# Patient Record
Sex: Male | Born: 1990
Health system: Southern US, Community
[De-identification: ages and names within clinical notes are randomized; demographics above are authoritative.]

## PROBLEM LIST (undated history)

## (undated) DIAGNOSIS — Z87898 Personal history of other specified conditions: Secondary | ICD-10-CM

## (undated) DIAGNOSIS — R011 Cardiac murmur, unspecified: Secondary | ICD-10-CM

## (undated) DIAGNOSIS — I4891 Unspecified atrial fibrillation: Secondary | ICD-10-CM

## (undated) DIAGNOSIS — F419 Anxiety disorder, unspecified: Secondary | ICD-10-CM

## (undated) HISTORY — PX: TONSILLECTOMY: SUR1361

## (undated) HISTORY — PX: ADENOIDECTOMY: SUR15

## (undated) HISTORY — DX: Personal history of other specified conditions: Z87.898

## (undated) HISTORY — DX: Cardiac murmur, unspecified: R01.1

## (undated) HISTORY — DX: Unspecified atrial fibrillation: I48.91

---
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
## 2014-01-23 ENCOUNTER — Encounter (HOSPITAL_BASED_OUTPATIENT_CLINIC_OR_DEPARTMENT_OTHER): Payer: Self-pay | Admitting: Emergency Medicine

## 2014-01-23 ENCOUNTER — Emergency Department (HOSPITAL_BASED_OUTPATIENT_CLINIC_OR_DEPARTMENT_OTHER): Payer: BC Managed Care – PPO

## 2014-01-23 ENCOUNTER — Emergency Department (HOSPITAL_BASED_OUTPATIENT_CLINIC_OR_DEPARTMENT_OTHER)
Admission: EM | Admit: 2014-01-23 | Discharge: 2014-01-23 | Disposition: A | Discharge status: Home / Self Care | Payer: BC Managed Care – PPO | Attending: Emergency Medicine | Admitting: Emergency Medicine

## 2014-01-23 DIAGNOSIS — S6000XA Contusion of unspecified finger without damage to nail, initial encounter: Secondary | ICD-10-CM | POA: Insufficient documentation

## 2014-01-23 DIAGNOSIS — Y9389 Activity, other specified: Secondary | ICD-10-CM | POA: Insufficient documentation

## 2014-01-23 DIAGNOSIS — S61209A Unspecified open wound of unspecified finger without damage to nail, initial encounter: Secondary | ICD-10-CM | POA: Insufficient documentation

## 2014-01-23 DIAGNOSIS — S61219A Laceration without foreign body of unspecified finger without damage to nail, initial encounter: Secondary | ICD-10-CM

## 2014-01-23 DIAGNOSIS — S6010XA Contusion of unspecified finger with damage to nail, initial encounter: Secondary | ICD-10-CM

## 2014-01-23 DIAGNOSIS — Y929 Unspecified place or not applicable: Secondary | ICD-10-CM | POA: Insufficient documentation

## 2014-01-23 DIAGNOSIS — W230XXA Caught, crushed, jammed, or pinched between moving objects, initial encounter: Secondary | ICD-10-CM | POA: Insufficient documentation

## 2014-01-23 MED ORDER — IBUPROFEN 600 MG PO TABS: 600.0000 mg | ORAL_TABLET | Freq: Four times a day (QID) | ORAL | Status: DC | PRN

## 2014-01-23 MED ORDER — CEPHALEXIN 500 MG PO CAPS: 500.0000 mg | ORAL_CAPSULE | Freq: Three times a day (TID) | ORAL | Status: DC

## 2014-01-23 MED ORDER — HYDROCODONE-ACETAMINOPHEN 5-325 MG PO TABS
1.0000 | ORAL_TABLET | Freq: Once | ORAL | Status: AC
  Administered 2014-01-23: 1 via ORAL
  Filled 2014-01-23: qty 1

## 2014-01-23 MED ORDER — HYDROCODONE-ACETAMINOPHEN 5-325 MG PO TABS: 2.0000 | ORAL_TABLET | ORAL | Status: DC | PRN

## 2014-01-23 MED ORDER — BACITRACIN 500 UNIT/GM EX OINT
1.0000 "application " | TOPICAL_OINTMENT | Freq: Two times a day (BID) | CUTANEOUS | Status: DC
  Administered 2014-01-23: 1 via TOPICAL
  Filled 2014-01-23: qty 0.9

## 2014-01-23 NOTE — ED Notes (Signed)
Pt lacerated right hand fourth digit caught between wood and brick

## 2014-01-23 NOTE — ED Provider Notes (Signed)
CSN: [id number]     Arrival date & time 01/23/14  2009 History   First MD Initiated Contact with Patient 01/23/14 2127     Chief Complaint  Patient presents with  . Extremity Laceration     (Consider location/radiation/quality/duration/timing/severity/associated sxs/prior Treatment) Patient is a 23 y.o. male presenting with skin laceration. The history is provided by the patient.  Laceration Location:  Finger Finger laceration location:  R ring finger Depth:  Through underlying tissue Quality: jagged   Bleeding: controlled   Injury mechanism: caught between brick and wood. Pain details:    Quality:  Sharp and throbbing   Severity:  Moderate   Timing:  Constant   Progression:  Worsening Foreign body present:  Unable to specify Relieved by:  Nothing Worsened by:  Movement  Mario Luna is a 23 y.o. male who presents to the ED with a laceration to the ring finger of the right hand. He caught his hand between a brick and wood. It mashed his hand and cut his finger. He complains of pain with any movement of his finger. He denies any other injuries or problems tonight.    No past medical history on file. No past surgical history on file. No family history on file. History  Substance Use Topics  . Smoking status: Not on file  . Smokeless tobacco: Not on file  . Alcohol Use: Not on file    Review of Systems Negative except as stated in HPI   Allergies  Review of patient's allergies indicates no known allergies.  Home Medications  No current outpatient prescriptions on file. BP 136/81  Pulse 82  Temp(Src) 98.7 F (37.1 C) (Oral)  Resp 16  SpO2 100% Physical Exam  Nursing note and vitals reviewed. Constitutional: He is oriented to person, place, and time. He appears well-developed and well-nourished. No distress.  HENT:  Head: Normocephalic and atraumatic.  Eyes: Conjunctivae and EOM are normal.  Neck: Neck supple.  Cardiovascular: Normal rate.   Pulmonary/Chest:  Effort normal.  Musculoskeletal:       Right hand: He exhibits tenderness, bony tenderness, laceration and swelling. Normal sensation noted. Normal strength noted. He exhibits no finger abduction.       Hands: Neurological: He is alert and oriented to person, place, and time. No cranial nerve deficit.  Skin: Skin is warm and dry.  Psychiatric: He has a normal mood and affect. His behavior is normal.   Dg Hand Complete Right  01/23/2014   CLINICAL DATA:  Pain anterior right fourth digit. Lacerated right hand  EXAM: RIGHT HAND - COMPLETE 3+ VIEW  COMPARISON:  None.  FINDINGS: No evidence of fracture of the carpal or metacarpal bones. Radiocarpal joint is intact. Phalanges are normal. No soft tissue injury.  IMPRESSION: No acute osseous abnormality.   Electronically Signed   By: Genevive Bi M.D.   On: 01/23/2014 22:28    ED Course  Procedures Wound soaked in betadine and NSS, hydrocodone 5/325 mg given for pain.  LACERATION REPAIR Performed by: Elira Colasanti Authorized by: Alizea Pell Consent: Verbal consent obtained. Risks and benefits: risks, benefits and alternatives were discussed Consent given by: patient Patient identity confirmed: provided demographic data Prepped and Draped in normal sterile fashion Wound explored  Laceration Location: right ring finger  Laceration Length: 2 cm  No Foreign Bodies seen or palpated  Anesthesia: local infiltration  Local anesthetic: lidocaine 2% without epinephrine  Anesthetic total: 1.5 ml  Irrigation method: syringe Amount of cleaning: standard  Skin closure: 5-0 prolene  Number of sutures: 2  Technique: interrupted  Patient tolerance: Patient tolerated the procedure well with no immediate complications.   Procedure: #2 Subungual hematoma of right ring finger, cleaned with betadine, using cautery stick nail punctured and blood drained with good results. Patient with immediate pain improvement.   MDM  23 y.o. male with  laceration to the palmar aspect of the right ring finger at the DIP, subungual hematoma of right index finger. Bacitracin ointment to wound, dressing and finger splint applied. Patient to follow up in one week for suture removal. He will return here sooner for any problems. Will start antibiotics and pain management. Patient stable for discharge and remains neurovascularly intact.    Medication List         cephALEXin 500 MG capsule  Commonly known as:  KEFLEX  Take 1 capsule (500 mg total) by mouth 3 (three) times daily.     HYDROcodone-acetaminophen 5-325 MG per tablet  Commonly known as:  NORCO/VICODIN  Take 2 tablets by mouth every 4 (four) hours as needed.     ibuprofen 600 MG tablet  Commonly known as:  ADVIL,MOTRIN  Take 1 tablet (600 mg total) by mouth every 6 (six) hours as needed.         Mario Luna M Mario Luna, [place]NP 01/23/14 2329

## 2014-01-23 NOTE — ED Provider Notes (Signed)
History/physical exam/procedure(s) were performed by non-physician practitioner and as supervising physician I was immediately available for consultation/collaboration. I have reviewed all notes and am in agreement with care and plan.   Hilario Quarryanielle S Maraya Gwilliam, MD 01/23/14 [phone]

## 2015-05-19 DIAGNOSIS — I4891 Unspecified atrial fibrillation: Secondary | ICD-10-CM

## 2015-05-19 HISTORY — DX: Unspecified atrial fibrillation: I48.91

## 2017-02-09 ENCOUNTER — Emergency Department (HOSPITAL_COMMUNITY)
Admission: EM | Admit: 2017-02-09 | Discharge: 2017-02-09 | Disposition: A | Discharge status: Home / Self Care | Payer: Self-pay | Attending: Emergency Medicine | Admitting: Emergency Medicine

## 2017-02-09 ENCOUNTER — Emergency Department (HOSPITAL_COMMUNITY): Payer: Self-pay

## 2017-02-09 DIAGNOSIS — Y939 Activity, unspecified: Secondary | ICD-10-CM | POA: Insufficient documentation

## 2017-02-09 DIAGNOSIS — M25562 Pain in left knee: Secondary | ICD-10-CM

## 2017-02-09 DIAGNOSIS — X58XXXA Exposure to other specified factors, initial encounter: Secondary | ICD-10-CM | POA: Insufficient documentation

## 2017-02-09 DIAGNOSIS — S8392XA Sprain of unspecified site of left knee, initial encounter: Secondary | ICD-10-CM | POA: Insufficient documentation

## 2017-02-09 DIAGNOSIS — Y999 Unspecified external cause status: Secondary | ICD-10-CM | POA: Insufficient documentation

## 2017-02-09 DIAGNOSIS — Y929 Unspecified place or not applicable: Secondary | ICD-10-CM | POA: Insufficient documentation

## 2017-02-09 MED ORDER — OXYCODONE HCL 5 MG PO TABS
5.0000 mg | ORAL_TABLET | Freq: Once | ORAL | Status: AC
  Administered 2017-02-09: 5 mg via ORAL
  Filled 2017-02-09: qty 1

## 2017-02-09 MED ORDER — KETOROLAC TROMETHAMINE 60 MG/2ML IM SOLN
60.0000 mg | Freq: Once | INTRAMUSCULAR | Status: AC
  Administered 2017-02-09: 60 mg via INTRAMUSCULAR
  Filled 2017-02-09: qty 2

## 2017-02-09 MED ORDER — ACETAMINOPHEN 500 MG PO TABS
1000.0000 mg | ORAL_TABLET | Freq: Once | ORAL | Status: AC
  Administered 2017-02-09: 1000 mg via ORAL
  Filled 2017-02-09: qty 2

## 2017-02-09 MED ORDER — DIAZEPAM 5 MG PO TABS
5.0000 mg | ORAL_TABLET | Freq: Once | ORAL | Status: AC
  Administered 2017-02-09: 5 mg via ORAL
  Filled 2017-02-09: qty 1

## 2017-02-09 NOTE — ED Provider Notes (Signed)
[facility] Provider Note   CSN: [id number] Arrival date & time: 02/09/17  0325   By signing my name below, I, Clovis Pu, attest that this documentation has been prepared under the direction and in the presence of Melene Plan, DO  Electronically Signed: Clovis Pu, ED Scribe. 02/09/17. 3:51 AM.   History   Chief Complaint Chief Complaint  Patient presents with  . Knee Pain    HPI Comments:  Mario Luna is a 25 y.o. male who presents to the Emergency Department complaining of acute onset, moderate left knee pain which began PTA. His pain is worse upon palpation and with movement. He notes a hx of intermittent pain with the same knee. No alleviating factors noted. Pt denies any other associated symptoms. No other complaints noted.    The history is provided by the patient. No language interpreter was used.  Knee Pain   This is a new problem. The current episode started 3 to 5 hours ago. The problem occurs constantly. The problem has not changed since onset.The pain is present in the left knee. The pain is moderate. Pertinent negatives include no numbness. The symptoms are aggravated by contact. He has tried nothing for the symptoms. The treatment provided no relief. There has been no history of extremity trauma.    No past medical history on file.  There are no active problems to display for this patient.   No past surgical history on file.     Home Medications    Prior to Admission medications   Medication Sig Start Date End Date Taking? Authorizing Provider  cephALEXin (KEFLEX) 500 MG capsule Take 1 capsule (500 mg total) by mouth 3 (three) times daily. 01/23/14   Hope Orlene Och, NP  HYDROcodone-acetaminophen (NORCO/VICODIN) 5-325 MG per tablet Take 2 tablets by mouth every 4 (four) hours as needed. 01/23/14   Hope Orlene Och, NP  ibuprofen (ADVIL,MOTRIN) 600 MG tablet Take 1 tablet (600 mg total) by mouth every 6 (six) hours as needed. 01/23/14   Hope Orlene Och, NP     Family History No family history on file.  Social History Social History  Substance Use Topics  . Smoking status: Never Smoker  . Smokeless tobacco: Not on file  . Alcohol use No     Allergies   Patient has no known allergies.   Review of Systems Review of Systems  Constitutional: Negative for chills and fever.  HENT: Negative for congestion and facial swelling.   Eyes: Negative for discharge and visual disturbance.  Respiratory: Negative for shortness of breath.   Cardiovascular: Negative for chest pain and palpitations.  Gastrointestinal: Negative for abdominal pain, diarrhea and vomiting.  Musculoskeletal: Positive for myalgias. Negative for arthralgias.  Skin: Negative for color change and rash.  Neurological: Negative for tremors, syncope, numbness and headaches.  Psychiatric/Behavioral: Negative for confusion and dysphoric mood.     Physical Exam Updated Vital Signs BP 140/79   Pulse 85   Temp 97.9 F (36.6 C)   Resp 18   SpO2 100%   Physical Exam  Constitutional: He is oriented to person, place, and time. He appears well-developed and well-nourished.  HENT:  Head: Normocephalic and atraumatic.  Eyes: EOM are normal. Pupils are equal, round, and reactive to light.  Neck: Normal range of motion. Neck supple. No JVD present.  Cardiovascular: Normal rate and regular rhythm.  Exam reveals no gallop and no friction rub.   No murmur heard. Pulmonary/Chest: No respiratory distress. He has no wheezes.  Abdominal: He exhibits no distension. There is no rebound and no guarding.  Musculoskeletal: Normal range of motion. He exhibits no edema.  No swelling, no warmth, no focal area of pain. Ligaments all feel stable. Pulse, motor and sensation intact.   Neurological: He is alert and oriented to person, place, and time.  Skin: No rash noted. No pallor.  Psychiatric: He has a normal mood and affect. His behavior is normal.  Nursing note and vitals  reviewed.    ED Treatments / Results  DIAGNOSTIC STUDIES:  Oxygen Saturation is 100% on RA, normal by my interpretation.    COORDINATION OF CARE:  3:50 AM Discussed treatment plan with pt at bedside and pt agreed to plan.  Labs (all labs ordered are listed, but only abnormal results are displayed) Labs Reviewed - No data to display  EKG  EKG Interpretation None       Radiology No results found.  Procedures Procedures (including critical care time)  Medications Ordered in ED Medications  ketorolac (TORADOL) injection 60 mg (60 mg Intramuscular Given 02/09/17 0403)  diazepam (VALIUM) tablet 5 mg (5 mg Oral Given 02/09/17 0402)  oxyCODONE (Oxy IR/ROXICODONE) immediate release tablet 5 mg (5 mg Oral Given 02/09/17 0402)  acetaminophen (TYLENOL) tablet 1,000 mg (1,000 mg Oral Given 02/09/17 0402)     Initial Impression / Assessment and Plan / ED Course  I have reviewed the triage vital signs and the nursing notes.  Pertinent labs & imaging results that were available during my care of the patient were reviewed by me and considered in my medical decision making (see chart for details).     26 yo M With a chief complaint of left knee pain. Diffusely across the knee. No specific injury. Exam limited due to pain. There is no erythema or warmth or fevers or doubt this is septic. We'll place in a knee immobilizer, ibuprofen and follow-up with ortho.   4:10 AM:  I have discussed the diagnosis/risks/treatment options with the patient and family and believe the pt to be eligible for discharge home to follow-up with Ortho. We also discussed returning to the ED immediately if new or worsening sx occur. We discussed the sx which are most concerning (e.g., sudden worsening pain, fever, inability to tolerate by mouth) that necessitate immediate return. Medications administered to the patient during their visit and any new prescriptions provided to the patient are listed below.  Medications  given during this visit Medications  ketorolac (TORADOL) injection 60 mg (60 mg Intramuscular Given 02/09/17 0403)  diazepam (VALIUM) tablet 5 mg (5 mg Oral Given 02/09/17 0402)  oxyCODONE (Oxy IR/ROXICODONE) immediate release tablet 5 mg (5 mg Oral Given 02/09/17 0402)  acetaminophen (TYLENOL) tablet 1,000 mg (1,000 mg Oral Given 02/09/17 0402)     The patient appears reasonably screen and/or stabilized for discharge and I doubt any other medical condition or other [facility] requiring further screening, evaluation, or treatment in the ED at this time prior to discharge.    Final Clinical Impressions(s) / ED Diagnoses   Final diagnoses:  None    New Prescriptions New Prescriptions   No medications on file   I personally performed the services described in this documentation, which was scribed in my presence. The recorded information has been reviewed and is accurate.     Melene Plan, DO 02/09/17 [phone]

## 2017-02-09 NOTE — Discharge Instructions (Signed)
Take 4 over the counter ibuprofen tablets 3 times a day or 2 over-the-counter naproxen tablets twice a day for pain. Also take tylenol 1000mg(2 extra strength) four times a day.    

## 2017-02-09 NOTE — ED Triage Notes (Signed)
Pt c/o L knee pain, denies injury. Tenderness and mild swelling to lateral side of knee. Pt reports difficulty ambulating

## 2017-02-09 NOTE — ED Notes (Signed)
Taken to xray at this time. 

## 2017-06-23 ENCOUNTER — Ambulatory Visit (HOSPITAL_COMMUNITY)
Admission: EM | Admit: 2017-06-23 | Discharge: 2017-06-23 | Disposition: A | Discharge status: Home / Self Care | Payer: Self-pay | Attending: Family Medicine | Admitting: Family Medicine

## 2017-06-23 ENCOUNTER — Encounter (HOSPITAL_COMMUNITY): Payer: Self-pay | Admitting: *Deleted

## 2017-06-23 DIAGNOSIS — J069 Acute upper respiratory infection, unspecified: Secondary | ICD-10-CM

## 2017-06-23 MED ORDER — GUAIFENESIN-CODEINE 100-10 MG/5ML PO SOLN: 5.0000 mL | Freq: Three times a day (TID) | ORAL | 0 refills | Status: DC | PRN

## 2017-06-23 MED ORDER — IPRATROPIUM BROMIDE 0.06 % NA SOLN: 2.0000 | Freq: Four times a day (QID) | NASAL | 0 refills | Status: DC

## 2017-06-23 NOTE — ED Provider Notes (Signed)
  [facility]   [id number] 06/23/17 Arrival Time: 1604   SUBJECTIVE:  Mario Luna is a 26 y.o. male who presents to the urgent care  with complaint of productive cough, as well as fever, chills, runny nose, and sore throat for 24 hours. Denies any nausea, vomiting, or diarrhea, denies any abdominal pain, denies any ear pain or pressure. He works at [employer], denies any alcohol or tobacco use.  ROS: As per HPI, remainder of ROS negative.   OBJECTIVE:  Vitals:   06/23/17 1618  BP: 138/81  Pulse: 83  Resp: 16  Temp: 98.8 F (37.1 C)  TempSrc: Oral  SpO2: 100%     General appearance: alert; no distress HEENT: normocephalic; atraumatic; conjunctivae normal; Tympanic membranes pearly grey without erythema or bulging, oropharynx clear without erythema or edema, tonsils +3 without exudate, no tender sinuses, does have submandibular and tonsillar lymphadenopathy Neck: Trachea midline, no cervical lymphadenopathy Lungs: clear to auscultation bilaterally Heart: regular rate and rhythm Abdomen: soft, non-tender; bowel sounds normal; no masses or organomegaly; no guarding or rebound tenderness Musculoskeletal: Fluid gait, grossly symmetrical Skin: warm and dry Neurologic: Grossly normal Psychological:  alert and cooperative; normal mood and affect     ASSESSMENT & PLAN:  1. Viral upper respiratory tract infection     Meds ordered this encounter  Medications  . guaiFENesin-codeine 100-10 MG/5ML syrup    Sig: Take 5 mLs by mouth 3 (three) times daily as needed for cough.    Dispense:  120 mL    Refill:  0    Order Specific Question:   Supervising Provider    Answer:   Elvina Sidle [[id number]]  . ipratropium (ATROVENT) 0.06 % nasal spray    Sig: Place 2 sprays into both nostrils 4 (four) times daily.    Dispense:  15 mL    Refill:  0    Order Specific Question:   Supervising Provider    Answer:   Elvina Sidle [[id number]]    Reviewed expectations re: course of  current medical issues. Questions answered. Outlined signs and symptoms indicating need for more acute intervention. Patient verbalized understanding. After Visit Summary given.    Procedures:     Labs Reviewed - No data to display  No results found.  No Known Allergies  PMHx, SurgHx, SocialHx, Medications, and Allergies were reviewed in the Visit Navigator and updated as appropriate.       Dorena Bodo, NP 06/23/17 [phone]

## 2017-06-23 NOTE — Discharge Instructions (Signed)
You most likely have a viral respiratory infection, this type of infection will not be helped by antibiotics.  For your symptoms I have prescribed Cheratussin for cough and ipratropium for runny nose  Ceritussin is a narcotic, it will cause drowsiness, and it is addictive. Do not take more than what is necessary, do not drink alcohol while taking, and do not operate any heavy machinery while taking this medicine. .   In addition to these therapies, I advise rest, plenty of fluids and management of symptoms with over the counter medicines. Over the counter therapies for your symptoms include:Tylenol as needed every 4-6 hours for body aches or fever, not to exceed 4,000 mg a day, Take mucinex or mucinex DM ever 12 hours with a full glass of water, you may use an inhaled steroid such as Flonase, 2 sprays each nostril once a day for congestion, or an antihistamine such as Claritin or Zyrtec once a day. Another alternative for congestion, is a pseudoephedrine containing product available from the pharmacist. Should your symptoms worsen or fail to resolve, follow up with your primary care provider or return to clinic.

## 2017-06-23 NOTE — ED Triage Notes (Signed)
C/O starting with nasal congestion, runny nose, HA, sneezing, cough since yesterday.  Unsure if fevers.

## 2017-09-12 ENCOUNTER — Encounter (HOSPITAL_COMMUNITY): Payer: Self-pay | Admitting: Emergency Medicine

## 2017-09-12 ENCOUNTER — Ambulatory Visit (HOSPITAL_COMMUNITY)
Admission: EM | Admit: 2017-09-12 | Discharge: 2017-09-12 | Disposition: A | Discharge status: Home / Self Care | Payer: Self-pay | Attending: Family Medicine | Admitting: Family Medicine

## 2017-09-12 DIAGNOSIS — R369 Urethral discharge, unspecified: Secondary | ICD-10-CM | POA: Insufficient documentation

## 2017-09-12 LAB — POCT URINALYSIS DIP (DEVICE)
Bilirubin Urine: NEGATIVE
GLUCOSE, UA: NEGATIVE mg/dL
HGB URINE DIPSTICK: NEGATIVE
KETONES UR: NEGATIVE mg/dL
Nitrite: NEGATIVE
Protein, ur: NEGATIVE mg/dL
SPECIFIC GRAVITY, URINE: 1.015 (ref 1.005–1.030)
UROBILINOGEN UA: 1 mg/dL (ref 0.0–1.0)
pH: 7 (ref 5.0–8.0)

## 2017-09-12 MED ORDER — AZITHROMYCIN 250 MG PO TABS
1000.0000 mg | ORAL_TABLET | Freq: Once | ORAL | Status: AC
  Administered 2017-09-12: 1000 mg via ORAL

## 2017-09-12 MED ORDER — CEFTRIAXONE SODIUM 250 MG IJ SOLR
250.0000 mg | Freq: Once | INTRAMUSCULAR | Status: AC
  Administered 2017-09-12: 250 mg via INTRAMUSCULAR

## 2017-09-12 MED ORDER — CEFTRIAXONE SODIUM 250 MG IJ SOLR
INTRAMUSCULAR | Status: AC
  Filled 2017-09-12: qty 250

## 2017-09-12 MED ORDER — STERILE WATER FOR INJECTION IJ SOLN
INTRAMUSCULAR | Status: AC
  Filled 2017-09-12: qty 10

## 2017-09-12 MED ORDER — AZITHROMYCIN 250 MG PO TABS
ORAL_TABLET | ORAL | Status: AC
  Filled 2017-09-12: qty 4

## 2017-09-12 NOTE — ED Provider Notes (Signed)
  [facility]   [id number] 09/12/17 Arrival Time: 1001  ASSESSMENT & PLAN:  1. Penile discharge     Meds ordered this encounter  Medications  . cefTRIAXone (ROCEPHIN) injection 250 mg    Order Specific Question:   Antibiotic Indication:    Answer:   STD  . azithromycin (ZITHROMAX) tablet 1,000 mg   Urine cytology and urine culture sent. Will notify of any positive results. Reviewed expectations re: course of current medical issues. Questions answered. Outlined signs and symptoms indicating need for more acute intervention. Patient verbalized understanding. After Visit Summary given.   SUBJECTIVE:  Mario Luna is a 26 y.o. male who presents with complaint of penile discharge for 2 days. Describes discharge as thick and white/yellow. Afebrile. No abdominal or pelvic pain. No n/v. No rashes or lesions. Sexually active with multiple male partner(s). Tries to use condoms regularly. OTC treatment: None. History of similar symptoms: Yes. Several months ago. [facility] and [facility].  ROS: As per HPI.  OBJECTIVE:  Vitals:   09/12/17 1025 09/12/17 1026  BP:  (!) 144/82  Pulse:  81  Resp:  16  Temp:  98.4 F (36.9 C)  TempSrc:  Oral  SpO2:  100%  Weight: 196 lb (88.9 kg)   Height: 5\' 5"  (1.651 m)     General appearance: alert, cooperative, appears stated age and no distress Throat: lips, mucosa, and tongue normal; teeth and gums normal Back: no CVA tenderness Abdomen: soft, non-tender; bowel sounds normal; no masses or organomegaly; no guarding or rebound tenderness GU: declines Skin: warm and dry Psychological:  Alert and cooperative. Normal mood and affect.  Results for orders placed or performed during the hospital encounter of 09/12/17  POCT urinalysis dip (device)  Result Value Ref Range   Glucose, UA NEGATIVE NEGATIVE mg/dL   Bilirubin Urine NEGATIVE NEGATIVE   Ketones, ur NEGATIVE NEGATIVE mg/dL   Specific  Gravity, Urine 1.015 1.005 - 1.030   Hgb urine dipstick NEGATIVE NEGATIVE   pH 7.0 5.0 - 8.0   Protein, ur NEGATIVE NEGATIVE mg/dL   Urobilinogen, UA 1.0 0.0 - 1.0 mg/dL   Nitrite NEGATIVE NEGATIVE   Leukocytes, UA SMALL (A) NEGATIVE    Labs Reviewed  POCT URINALYSIS DIP (DEVICE) - Abnormal; Notable for the following components:      Result Value   Leukocytes, UA SMALL (*)    All other components within normal limits  URINE CULTURE  URINE CYTOLOGY ANCILLARY ONLY    No Known Allergies    Social History   Socioeconomic History  . Marital status: Single    Spouse name: Not on file  . Number of children: Not on file  . Years of education: Not on file  . Highest education level: Not on file  Social Needs  . Financial resource strain: Not on file  . Food insecurity - worry: Not on file  . Food insecurity - inability: Not on file  . Transportation needs - medical: Not on file  . Transportation needs - non-medical: Not on file  Occupational History  . Not on file  Tobacco Use  . Smoking status: Current Some Day Smoker  Substance and Sexual Activity  . Alcohol use: No  . Drug use: No  . Sexual activity: Not on file  Other Topics Concern  . Not on file  Social History Narrative  . Not on file          Mardella LaymanHagler, Promiss Labarbera, MD 09/12/17 1104

## 2017-09-12 NOTE — Discharge Instructions (Signed)
You have been given the following medications today for treatment of suspected gonorrhea and/or chlamydia: ° °cefTRIAXone (ROCEPHIN) injection 250 mg °azithromycin (ZITHROMAX) tablet 1,000 mg ° °Even though we have treated you today, we have sent testing for sexually transmitted infections. We will notify you of any positive results once they are received. If required, we will prescribe any medications you might need. ° °

## 2017-09-12 NOTE — ED Triage Notes (Signed)
PT reports dysuria that started a few days ago. PT was treated for same a few months ago.

## 2017-09-13 LAB — URINE CYTOLOGY ANCILLARY ONLY
Chlamydia: NEGATIVE
NEISSERIA GONORRHEA: POSITIVE — AB
TRICH (WINDOWPATH): NEGATIVE

## 2017-09-13 LAB — URINE CULTURE: CULTURE: NO GROWTH

## 2018-01-23 ENCOUNTER — Encounter (HOSPITAL_COMMUNITY): Payer: Self-pay | Admitting: Emergency Medicine

## 2018-01-23 ENCOUNTER — Ambulatory Visit (HOSPITAL_COMMUNITY)
Admission: EM | Admit: 2018-01-23 | Discharge: 2018-01-23 | Disposition: A | Discharge status: Home / Self Care | Payer: Self-pay | Attending: Family Medicine | Admitting: Family Medicine

## 2018-01-23 ENCOUNTER — Other Ambulatory Visit: Payer: Self-pay

## 2018-01-23 DIAGNOSIS — Z113 Encounter for screening for infections with a predominantly sexual mode of transmission: Secondary | ICD-10-CM | POA: Insufficient documentation

## 2018-01-23 DIAGNOSIS — F17200 Nicotine dependence, unspecified, uncomplicated: Secondary | ICD-10-CM | POA: Insufficient documentation

## 2018-01-23 DIAGNOSIS — Z79899 Other long term (current) drug therapy: Secondary | ICD-10-CM | POA: Insufficient documentation

## 2018-01-23 DIAGNOSIS — R300 Dysuria: Secondary | ICD-10-CM

## 2018-01-23 LAB — POCT URINALYSIS DIP (DEVICE)
BILIRUBIN URINE: NEGATIVE
GLUCOSE, UA: NEGATIVE mg/dL
Hgb urine dipstick: NEGATIVE
KETONES UR: NEGATIVE mg/dL
Nitrite: NEGATIVE
Protein, ur: NEGATIVE mg/dL
Specific Gravity, Urine: 1.02 (ref 1.005–1.030)
Urobilinogen, UA: 2 mg/dL — ABNORMAL HIGH (ref 0.0–1.0)
pH: 7.5 (ref 5.0–8.0)

## 2018-01-23 MED ORDER — CEFTRIAXONE SODIUM 250 MG IJ SOLR
250.0000 mg | Freq: Once | INTRAMUSCULAR | Status: AC
  Administered 2018-01-23: 250 mg via INTRAMUSCULAR

## 2018-01-23 MED ORDER — CEFTRIAXONE SODIUM 250 MG IJ SOLR
INTRAMUSCULAR | Status: AC
  Filled 2018-01-23: qty 250

## 2018-01-23 MED ORDER — AZITHROMYCIN 250 MG PO TABS
1000.0000 mg | ORAL_TABLET | Freq: Once | ORAL | Status: AC
  Administered 2018-01-23: 1000 mg via ORAL

## 2018-01-23 MED ORDER — AZITHROMYCIN 250 MG PO TABS
ORAL_TABLET | ORAL | Status: AC
  Filled 2018-01-23: qty 4

## 2018-01-23 NOTE — Discharge Instructions (Addendum)

## 2018-01-23 NOTE — ED Triage Notes (Signed)
Pt reports burning with urination.  He denies any discharge or odor.

## 2018-01-24 LAB — URINE CYTOLOGY ANCILLARY ONLY
Chlamydia: NEGATIVE
Neisseria Gonorrhea: NEGATIVE
Trichomonas: NEGATIVE

## 2018-01-25 LAB — URINE CULTURE: CULTURE: NO GROWTH

## 2018-01-28 NOTE — ED Provider Notes (Signed)
[facility]   [id number] 01/23/18 Arrival Time: 1524  ASSESSMENT & PLAN:  1. Dysuria   2. Screening for STDs (sexually transmitted diseases)     Meds ordered this encounter  Medications  . cefTRIAXone (ROCEPHIN) injection 250 mg  . azithromycin (ZITHROMAX) tablet 1,000 mg   Pending: Labs Reviewed  POCT URINALYSIS DIP (DEVICE) - Abnormal; Notable for the following components:      Result Value   Urobilinogen, UA 2.0 (*)    Leukocytes, UA SMALL (*)    All other components within normal limits  URINE CULTURE  URINE CYTOLOGY ANCILLARY ONLY   Will notify of any positive results. Instructed to refrain from sexual activity for at least seven days.  Reviewed expectations re: course of current medical issues. Questions answered. Outlined signs and symptoms indicating need for more acute intervention. Patient verbalized understanding. After Visit Summary given.   SUBJECTIVE:  Mario Luna is a 27 y.o. male who presents with complaint of burning with urination. Onset abrupt, 1 day ago. Describes discharge as thick and white/yellow "but not much of it". Afebrile. No abdominal or pelvic pain. No n/v. No rashes or lesions. Sexually active with single male partner.  ROS: As per HPI.  OBJECTIVE:  Vitals:   01/23/18 1551  BP: 138/68  Pulse: 75  Temp: 98.2 F (36.8 C)  TempSrc: Oral  SpO2: 100%    General appearance: alert, cooperative, appears stated age and no distress Throat: lips, mucosa, and tongue normal; teeth and gums normal Back: no CVA tenderness Abdomen: soft, non-tender; bowel sounds normal; no masses or organomegaly; no guarding or rebound tenderness GU: declines Skin: warm and dry Psychological: alert and cooperative; normal mood and affect.   Labs Reviewed  POCT URINALYSIS DIP (DEVICE) - Abnormal; Notable for the following components:      Result Value   Urobilinogen, UA 2.0 (*)    Leukocytes, UA SMALL (*)    All other components within  normal limits  URINE CULTURE  URINE CYTOLOGY ANCILLARY ONLY   No Known Allergies  Social History   Socioeconomic History  . Marital status: Single    Spouse name: Not on file  . Number of children: Not on file  . Years of education: Not on file  . Highest education level: Not on file  Occupational History  . Not on file  Social Needs  . Financial resource strain: Not on file  . Food insecurity:    Worry: Not on file    Inability: Not on file  . Transportation needs:    Medical: Not on file    Non-medical: Not on file  Tobacco Use  . Smoking status: Current Some Day Smoker  . Smokeless tobacco: Never Used  Substance and Sexual Activity  . Alcohol use: No  . Drug use: No  . Sexual activity: Not on file  Lifestyle  . Physical activity:    Days per week: Not on file    Minutes per session: Not on file  . Stress: Not on file  Relationships  . Social connections:    Talks on phone: Not on file    Gets together: Not on file    Attends religious service: Not on file    Active member of club or organization: Not on file    Attends meetings of clubs or organizations: Not on file    Relationship status: Not on file  . Intimate partner violence:    Fear of current or ex partner: Not on file  Emotionally abused: Not on file    Physically abused: Not on file    Forced sexual activity: Not on file  Other Topics Concern  . Not on file  Social History Narrative  . Not on file          Mardella Layman, MD 01/28/18 [phone]

## 2018-02-19 ENCOUNTER — Ambulatory Visit (HOSPITAL_COMMUNITY)
Admission: EM | Admit: 2018-02-19 | Discharge: 2018-02-19 | Disposition: A | Discharge status: Home / Self Care | Payer: Self-pay | Attending: Family Medicine | Admitting: Family Medicine

## 2018-02-19 ENCOUNTER — Encounter (HOSPITAL_COMMUNITY): Payer: Self-pay | Admitting: Family Medicine

## 2018-02-19 DIAGNOSIS — S0591XA Unspecified injury of right eye and orbit, initial encounter: Secondary | ICD-10-CM

## 2018-02-19 MED ORDER — MELOXICAM 7.5 MG PO TABS: 7.5000 mg | ORAL_TABLET | Freq: Every day | ORAL | 0 refills | Status: DC

## 2018-02-19 MED ORDER — OFLOXACIN 0.3 % OP SOLN: OPHTHALMIC | 0 refills | Status: DC

## 2018-02-19 MED ORDER — POLYETHYL GLYCOL-PROPYL GLYCOL 0.4-0.3 % OP GEL: 1.0000 "application " | Freq: Every evening | OPHTHALMIC | 0 refills | Status: DC | PRN
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
## 2018-02-19 NOTE — Discharge Instructions (Addendum)
No cuts seen on the eye. Start mobic for pain. Use ofloxacin eyedrops as directed on right eye. Artificial tear gel at night. Wait 10-15 minutes between drops, always use artificial tear gel last, as it prevents drops from penetrating through. Lid scrubs and warm compresses as directed. Monitor for any worsening of symptoms, changes in vision, sensitivity to light, eye swelling, painful eye movement, follow up with ophthalmology for further evaluation.

## 2018-03-21 ENCOUNTER — Other Ambulatory Visit: Payer: Self-pay

## 2018-03-21 ENCOUNTER — Emergency Department (HOSPITAL_BASED_OUTPATIENT_CLINIC_OR_DEPARTMENT_OTHER)
Admission: EM | Admit: 2018-03-21 | Discharge: 2018-03-21 | Disposition: A | Discharge status: Home / Self Care | Payer: Self-pay | Attending: Emergency Medicine | Admitting: Emergency Medicine

## 2018-03-21 ENCOUNTER — Encounter (HOSPITAL_BASED_OUTPATIENT_CLINIC_OR_DEPARTMENT_OTHER): Payer: Self-pay | Admitting: Emergency Medicine

## 2018-03-21 DIAGNOSIS — Z79899 Other long term (current) drug therapy: Secondary | ICD-10-CM | POA: Insufficient documentation

## 2018-03-21 DIAGNOSIS — A64 Unspecified sexually transmitted disease: Secondary | ICD-10-CM | POA: Insufficient documentation

## 2018-03-21 DIAGNOSIS — F17200 Nicotine dependence, unspecified, uncomplicated: Secondary | ICD-10-CM | POA: Insufficient documentation

## 2018-03-21 LAB — URINALYSIS, ROUTINE W REFLEX MICROSCOPIC
BILIRUBIN URINE: NEGATIVE
GLUCOSE, UA: NEGATIVE mg/dL
HGB URINE DIPSTICK: NEGATIVE
KETONES UR: NEGATIVE mg/dL
Leukocytes, UA: NEGATIVE
Nitrite: NEGATIVE
PROTEIN: NEGATIVE mg/dL
Specific Gravity, Urine: 1.01 (ref 1.005–1.030)
pH: 6.5 (ref 5.0–8.0)

## 2018-03-21 MED ORDER — CEFTRIAXONE SODIUM 250 MG IJ SOLR
250.0000 mg | Freq: Once | INTRAMUSCULAR | Status: AC
  Administered 2018-03-21: 250 mg via INTRAMUSCULAR
  Filled 2018-03-21: qty 250

## 2018-03-21 MED ORDER — IBUPROFEN 400 MG PO TABS
400.0000 mg | ORAL_TABLET | Freq: Once | ORAL | Status: AC | PRN
  Administered 2018-03-21: 400 mg via ORAL
  Filled 2018-03-21: qty 1

## 2018-03-21 MED ORDER — AZITHROMYCIN 1 G PO PACK
1.0000 g | PACK | Freq: Once | ORAL | Status: AC
  Administered 2018-03-21: 1 g via ORAL
  Filled 2018-03-21: qty 1

## 2018-03-21 NOTE — ED Provider Notes (Signed)
[facility] EMERGENCY DEPARTMENT Provider Note   CSN: [id number] Arrival date & time: 03/21/18  0251     History   Chief Complaint Chief Complaint  Patient presents with  . Penile Rash    HPI Mario Luna is a 27 y.o. male.  The history is provided by the patient.  Penile Discharge  This is a new problem. The current episode started 2 days ago. The problem occurs constantly. The problem has not changed since onset.Pertinent negatives include no chest pain, no abdominal pain, no headaches and no shortness of breath. Nothing aggravates the symptoms. Nothing relieves the symptoms. He has tried nothing for the symptoms. The treatment provided no relief.  Penile discharge following oral sex.  Thought he has a bump on his penis as well.    No past medical history on file.  There are no active problems to display for this patient.   No past surgical history on file.      Home Medications    Prior to Admission medications   Medication Sig Start Date End Date Taking? Authorizing Provider  guaiFENesin-codeine 100-10 MG/5ML syrup Take 5 mLs by mouth 3 (three) times daily as needed for cough. 06/23/17   Dorena Bodo, NP  ipratropium (ATROVENT) 0.06 % nasal spray Place 2 sprays into both nostrils 4 (four) times daily. 06/23/17   Dorena Bodo, NP  meloxicam (MOBIC) 7.5 MG tablet Take 1 tablet (7.5 mg total) by mouth daily. 02/19/18   Cathie Hoops, Amy V, PA-C  ofloxacin (OCUFLOX) 0.3 % ophthalmic solution 1 drop every 4 hours for 2 days, then 4 times a day for 5 days 02/19/18   Cathie Hoops, Amy V, PA-C  Polyethyl Glycol-Propyl Glycol (SYSTANE) 0.4-0.3 % GEL ophthalmic gel Place 1 application into both eyes at bedtime as needed. 02/19/18   Belinda Fisher, PA-C    Family History No family history on file.  Social History Social History   Tobacco Use  . Smoking status: Current Some Day Smoker  . Smokeless tobacco: Never Used  Substance Use Topics  . Alcohol use: No  . Drug use: No      Allergies   Patient has no known allergies.   Review of Systems Review of Systems  Constitutional: Negative for fever.  HENT: Negative for drooling.   Respiratory: Negative for shortness of breath.   Cardiovascular: Negative for chest pain.  Gastrointestinal: Negative for abdominal pain.  Genitourinary: Positive for discharge. Negative for genital sores and penile pain.  Neurological: Negative for headaches.  Psychiatric/Behavioral: Negative for behavioral problems.  All other systems reviewed and are negative.    Physical Exam Updated Vital Signs BP (!) 147/92 (BP Location: Right Arm)   Pulse 96   Temp 98.3 F (36.8 C) (Oral)   Resp 16   SpO2 100%   Physical Exam  Constitutional: He is oriented to person, place, and time. He appears well-developed and well-nourished. No distress.  HENT:  Head: Normocephalic and atraumatic.  Mouth/Throat: No oropharyngeal exudate.  Eyes: Pupils are equal, round, and reactive to light. Conjunctivae are normal.  Neck: Normal range of motion. Neck supple.  Cardiovascular: Normal rate, regular rhythm, normal heart sounds and intact distal pulses.  Pulmonary/Chest: Effort normal and breath sounds normal. No stridor. He has no wheezes. He has no rales.  Abdominal: Soft. Bowel sounds are normal. He exhibits no mass. There is no tenderness. There is no rebound and no guarding.  Genitourinary:  Genitourinary Comments: Chaperone present, no penile lesions.  Positive penile discharge  Musculoskeletal: Normal range of motion.  Neurological: He is alert and oriented to person, place, and time. He displays normal reflexes.  Skin: Skin is warm and dry. Capillary refill takes less than 2 seconds.     ED Treatments / Results  Labs (all labs ordered are listed, but only abnormal results are displayed) Results for orders placed or performed during the hospital encounter of 01/23/18  Urine culture  Result Value Ref Range   Specimen Description  URINE, RANDOM    Special Requests NONE    Culture      NO GROWTH Performed at [facility], [address] [place], [place] [address]    Report Status 01/25/2018 FINAL   POCT urinalysis dip (device)  Result Value Ref Range   Glucose, UA NEGATIVE NEGATIVE mg/dL   Bilirubin Urine NEGATIVE NEGATIVE   Ketones, ur NEGATIVE NEGATIVE mg/dL   Specific Gravity, Urine 1.020 1.005 - 1.030   Hgb urine dipstick NEGATIVE NEGATIVE   pH 7.5 5.0 - 8.0   Protein, ur NEGATIVE NEGATIVE mg/dL   Urobilinogen, UA 2.0 (H) 0.0 - 1.0 mg/dL   Nitrite NEGATIVE NEGATIVE   Leukocytes, UA SMALL (A) NEGATIVE  Urine cytology ancillary only  Result Value Ref Range   Chlamydia Negative    Neisseria gonorrhea Negative    Trichomonas Negative    No results found.  EKG None  Radiology No results found.  Procedures Procedures (including critical care time)  Medications Ordered in ED Medications  azithromycin (ZITHROMAX) powder 1 g (1 g Oral Given 03/21/18 0320)  cefTRIAXone (ROCEPHIN) injection 250 mg (250 mg Intramuscular Given 03/21/18 0320)       Final Clinical Impressions(s) / ED Diagnoses  No sexual activity of any kind until 7 days after all partners treated.      Return for weakness, numbness, changes in vision or speech, fevers >100.4 unrelieved by medication, shortness of breath, intractable vomiting, or diarrhea, abdominal pain, Inability to tolerate liquids or food, cough, altered mental status or any concerns. No signs of systemic illness or infection. The patient is nontoxic-appearing on exam and vital signs are within normal limits.   I have reviewed the triage vital signs and the nursing notes. Pertinent labs &imaging results that were available during my care of the patient were reviewed by me and considered in my medical decision making (see chart for details).  After history, exam, and medical workup I feel the patient has been appropriately medically screened and is safe  for discharge home. Pertinent diagnoses were discussed with the patient. Patient was given return precautions.       Mallarie Voorhies, MD 03/21/18 [phone]

## 2018-03-21 NOTE — ED Triage Notes (Signed)
Pt reports rash on his penis that started x2 days ago. Pt denies penile discharge. Pt reports burning sensation when voiding. Pt reports receiving oral sex w/o wearing protection. Pt A+OX4, NAD.

## 2018-03-24 ENCOUNTER — Emergency Department (HOSPITAL_BASED_OUTPATIENT_CLINIC_OR_DEPARTMENT_OTHER)
Admission: EM | Admit: 2018-03-24 | Discharge: 2018-03-24 | Disposition: A | Discharge status: Home / Self Care | Payer: Self-pay | Attending: Emergency Medicine | Admitting: Emergency Medicine

## 2018-03-24 ENCOUNTER — Encounter (HOSPITAL_BASED_OUTPATIENT_CLINIC_OR_DEPARTMENT_OTHER): Payer: Self-pay | Admitting: Emergency Medicine

## 2018-03-24 ENCOUNTER — Other Ambulatory Visit: Payer: Self-pay

## 2018-03-24 DIAGNOSIS — Z79899 Other long term (current) drug therapy: Secondary | ICD-10-CM | POA: Insufficient documentation

## 2018-03-24 DIAGNOSIS — R360 Urethral discharge without blood: Secondary | ICD-10-CM | POA: Insufficient documentation

## 2018-03-24 DIAGNOSIS — F17210 Nicotine dependence, cigarettes, uncomplicated: Secondary | ICD-10-CM | POA: Insufficient documentation

## 2018-03-24 DIAGNOSIS — R369 Urethral discharge, unspecified: Secondary | ICD-10-CM

## 2018-03-24 LAB — GC/CHLAMYDIA PROBE AMP (~~LOC~~) NOT AT ARMC
CHLAMYDIA, DNA PROBE: NEGATIVE
Neisseria Gonorrhea: NEGATIVE

## 2018-03-24 NOTE — ED Provider Notes (Signed)
[facility] EMERGENCY DEPARTMENT Provider Note   CSN: [id number] Arrival date & time: 03/24/18  1330     History   Chief Complaint Chief Complaint  Patient presents with  . Penile Discharge    recheck    HPI Mario Luna is a 27 y.o. male.  HPI   27 year old male here with penile discharge.  Patient reports 4 days ago he developed some penile discharge.  He was seen in the ED 3 days ago for his complaint.  He was given Rocephin Zithromax for prophylactic STI.  GC and Chlamydia culture was sent.  He is here today when he noticed the penile discharge has not resolved.  No associated penile pain, burning urination, or rash.  He has had approximately 5 sexual partners within the past 6 months, using protection.  Remote history of gonorrhea.  Currently denies fever, back pain, abdominal pain or hematuria.  No past medical history on file.  There are no active problems to display for this patient.   No past surgical history on file.      Home Medications    Prior to Admission medications   Medication Sig Start Date End Date Taking? Authorizing Provider  guaiFENesin-codeine 100-10 MG/5ML syrup Take 5 mLs by mouth 3 (three) times daily as needed for cough. 06/23/17   Dorena Bodo, NP  ipratropium (ATROVENT) 0.06 % nasal spray Place 2 sprays into both nostrils 4 (four) times daily. 06/23/17   Dorena Bodo, NP  meloxicam (MOBIC) 7.5 MG tablet Take 1 tablet (7.5 mg total) by mouth daily. 02/19/18   Cathie Hoops, Amy V, PA-C  ofloxacin (OCUFLOX) 0.3 % ophthalmic solution 1 drop every 4 hours for 2 days, then 4 times a day for 5 days 02/19/18   Cathie Hoops, Amy V, PA-C  Polyethyl Glycol-Propyl Glycol (SYSTANE) 0.4-0.3 % GEL ophthalmic gel Place 1 application into both eyes at bedtime as needed. 02/19/18   Belinda Fisher, PA-C    Family History No family history on file.  Social History Social History   Tobacco Use  . Smoking status: Current Some Day Smoker  . Smokeless tobacco: Never  Used  Substance Use Topics  . Alcohol use: No  . Drug use: No     Allergies   Patient has no known allergies.   Review of Systems Review of Systems  All other systems reviewed and are negative.    Physical Exam Updated Vital Signs BP 124/73 (BP Location: Left Arm)   Pulse 92   Temp 98.2 F (36.8 C) (Oral)   Resp 18   Ht  (1.676 m)   Wt 83 kg (183 lb)   SpO2 99%   BMI 29.54 kg/m   Physical Exam  Constitutional: He appears well-developed and well-nourished. No distress.  HENT:  Head: Atraumatic.  Eyes: Conjunctivae are normal.  Neck: Neck supple.  Abdominal: Soft. He exhibits no distension. There is no tenderness.  Genitourinary:  Genitourinary Comments: Chaperone present during exam.  No inguinal lymphadenopathy or inguinal hernia noted.  Normal circumcised penis free of lesion or rash.  No obvious penile discharge.  Testicle are normal with normal lie, nontender to palpation.  Perineum soft.  No abnormal rash.  Neurological: He is alert.  Skin: No rash noted.  Psychiatric: He has a normal mood and affect.  Nursing note and vitals reviewed.    ED Treatments / Results  Labs (all labs ordered are listed, but only abnormal results are displayed) Labs Reviewed - No data to  display  EKG None  Radiology No results found.  Procedures Procedures (including critical care time)  Medications Ordered in ED Medications - No data to display   Initial Impression / Assessment and Plan / ED Course  I have reviewed the triage vital signs and the nursing notes.  Pertinent labs & imaging results that were available during my care of the patient were reviewed by me and considered in my medical decision making (see chart for details).     BP 124/73 (BP Location: Left Arm)   Pulse 92   Temp 98.2 F (36.8 C) (Oral)   Resp 18   Ht  (1.676 m)   Wt 83 kg (183 lb)   SpO2 99%   BMI 29.54 kg/m    Final Clinical Impressions(s) / ED Diagnoses   Final  diagnoses:  Penile discharge, without blood    ED Discharge Orders    None     4:51 PM Patient here with concern of penile discharge.  Was seen for the same complaint 3 days ago and received prophylactic treatment for STI.  I have review his GC and Chlamydia results and was negative.  Since patient did not have HIV and RPR testing, will obtain that test today.  Otherwise, encourage patient to avoid sexual activities until symptoms completely resolved.  Recommend using protection.  Otherwise patient is stable for discharge.   Fayrene Helper, PA-C 03/24/18 1654    Vanetta Mulders, MD 03/31/18 [phone]

## 2018-03-24 NOTE — Discharge Instructions (Signed)
Your gonorrhea and chlamydia testing from several days ago are normal.  We have obtained blood test for HIV and Syphilis.  If you tested positive you will be notify in the next few days.  Please avoid sexual activities until your symptoms completely resolved.  Wear protection at all time.

## 2018-03-24 NOTE — ED Triage Notes (Signed)
Pt states recent treatment for STD's and is concerned about his penis.

## 2018-03-25 LAB — RPR: RPR Ser Ql: NONREACTIVE

## 2018-03-25 LAB — HIV ANTIBODY (ROUTINE TESTING W REFLEX): HIV SCREEN 4TH GENERATION: NONREACTIVE

## 2019-01-02 ENCOUNTER — Emergency Department (HOSPITAL_BASED_OUTPATIENT_CLINIC_OR_DEPARTMENT_OTHER)
Admission: EM | Admit: 2019-01-02 | Discharge: 2019-01-02 | Disposition: A | Discharge status: Home / Self Care | Payer: Self-pay | Attending: Emergency Medicine | Admitting: Emergency Medicine

## 2019-01-02 ENCOUNTER — Emergency Department (HOSPITAL_BASED_OUTPATIENT_CLINIC_OR_DEPARTMENT_OTHER): Payer: Self-pay

## 2019-01-02 ENCOUNTER — Encounter (HOSPITAL_BASED_OUTPATIENT_CLINIC_OR_DEPARTMENT_OTHER): Payer: Self-pay | Admitting: Emergency Medicine

## 2019-01-02 ENCOUNTER — Other Ambulatory Visit: Payer: Self-pay

## 2019-01-02 DIAGNOSIS — F17200 Nicotine dependence, unspecified, uncomplicated: Secondary | ICD-10-CM | POA: Insufficient documentation

## 2019-01-02 DIAGNOSIS — R042 Hemoptysis: Secondary | ICD-10-CM | POA: Insufficient documentation

## 2019-01-02 LAB — CBC WITH DIFFERENTIAL/PLATELET
Abs Immature Granulocytes: 0.02 10*3/uL (ref 0.00–0.07)
BASOS ABS: 0 10*3/uL (ref 0.0–0.1)
Basophils Relative: 0 %
Eosinophils Absolute: 0.2 10*3/uL (ref 0.0–0.5)
Eosinophils Relative: 3 %
HEMATOCRIT: 44.9 % (ref 39.0–52.0)
HEMOGLOBIN: 15.2 g/dL (ref 13.0–17.0)
Immature Granulocytes: 0 %
LYMPHS ABS: 4.3 10*3/uL — AB (ref 0.7–4.0)
LYMPHS PCT: 55 %
MCH: 30.8 pg (ref 26.0–34.0)
MCHC: 33.9 g/dL (ref 30.0–36.0)
MCV: 90.9 fL (ref 80.0–100.0)
Monocytes Absolute: 0.6 10*3/uL (ref 0.1–1.0)
Monocytes Relative: 8 %
NEUTROS PCT: 34 %
NRBC: 0 % (ref 0.0–0.2)
Neutro Abs: 2.7 10*3/uL (ref 1.7–7.7)
PLATELETS: 237 10*3/uL (ref 150–400)
RBC: 4.94 MIL/uL (ref 4.22–5.81)
RDW: 12.7 % (ref 11.5–15.5)
WBC: 7.9 10*3/uL (ref 4.0–10.5)

## 2019-01-02 NOTE — ED Provider Notes (Signed)
[facility] Provider Note: Mario Dell, MD, FACEP  CSN: [id number] MRN: [id number] ARRIVAL: 01/02/19 at 0114 ROOM: [facility]   CHIEF COMPLAINT  Hemoptysis   HISTORY OF PRESENT ILLNESS  01/02/19 1:27 AM Daniels Luna is a 28 y.o. male who has had nasal congestion but no other cold symptoms for a week.  He is here with a productive cough since yesterday afternoon.  The cough has become productive of blood-streaked sputum 5 times.  He describes the blood streaks as not bright red and not dark.  He denies associated fever, shortness of breath or chest pain.    History reviewed. No pertinent past medical history.  Past Surgical History:  Procedure Laterality Date  . TONSILLECTOMY      No family history on file.  Social History   Tobacco Use  . Smoking status: Current Some Day Smoker    Packs/day: 0.50    Years: 5.00    Pack years: 2.50  . Smokeless tobacco: Never Used  Substance Use Topics  . Alcohol use: No  . Drug use: No    Prior to Admission medications   Medication Sig Start Date End Date Taking? Authorizing Provider  guaiFENesin-codeine 100-10 MG/5ML syrup Take 5 mLs by mouth 3 (three) times daily as needed for cough. 06/23/17   Dorena Bodo, NP  ipratropium (ATROVENT) 0.06 % nasal spray Place 2 sprays into both nostrils 4 (four) times daily. 06/23/17   Dorena Bodo, NP  meloxicam (MOBIC) 7.5 MG tablet Take 1 tablet (7.5 mg total) by mouth daily. 02/19/18   Cathie Hoops, Amy V, PA-C  ofloxacin (OCUFLOX) 0.3 % ophthalmic solution 1 drop every 4 hours for 2 days, then 4 times a day for 5 days 02/19/18   Cathie Hoops, Amy V, PA-C  Polyethyl Glycol-Propyl Glycol (SYSTANE) 0.4-0.3 % GEL ophthalmic gel Place 1 application into both eyes at bedtime as needed. 02/19/18   Belinda Fisher, PA-C    Allergies Patient has no known allergies.   REVIEW OF SYSTEMS  Negative except as noted here or in the History of Present Illness.   PHYSICAL EXAMINATION  Initial Vital  Signs Blood pressure 134/76, pulse 84, temperature 98.3 F (36.8 C), temperature source Oral, resp. rate 18, height 5\' 5"  (1.651 m), weight 79.4 kg, SpO2 100 %.  Examination General: Well-developed, well-nourished male in no acute distress; appearance consistent with age of record HENT: normocephalic; atraumatic; pharynx normal; no epistaxis Eyes: pupils equal, round and reactive to light; extraocular muscles intact Neck: supple Heart: regular rate and rhythm Lungs: clear to auscultation bilaterally Abdomen: soft; nondistended; nontender; bowel sounds present Extremities: No deformity; full range of motion; pulses normal; no edema Neurologic: Awake, alert and oriented; motor function intact in all extremities and symmetric; no facial droop Skin: Warm and dry Psychiatric: Normal mood and affect   RESULTS  Summary of this visit's results, reviewed by myself:   EKG Interpretation  Date/Time:    Ventricular Rate:    PR Interval:    QRS Duration:   QT Interval:    QTC Calculation:   R Axis:     Text Interpretation:        Laboratory Studies: Results for orders placed or performed during the hospital encounter of 01/02/19 (from the past 24 hour(s))  CBC with Differential/Platelet     Status: Abnormal   Collection Time: 01/02/19  2:04 AM  Result Value Ref Range   WBC 7.9 4.0 - 10.5 K/uL   RBC 4.94 4.22 - 5.81 MIL/uL  Hemoglobin 15.2 13.0 - 17.0 g/dL   HCT 37.3 42.8 - 76.8 %   MCV 90.9 80.0 - 100.0 fL   MCH 30.8 26.0 - 34.0 pg   MCHC 33.9 30.0 - 36.0 g/dL   RDW 11.5 72.6 - 20.3 %   Platelets 237 150 - 400 K/uL   nRBC 0.0 0.0 - 0.2 %   Neutrophils Relative % 34 %   Neutro Abs 2.7 1.7 - 7.7 K/uL   Lymphocytes Relative 55 %   Lymphs Abs 4.3 (H) 0.7 - 4.0 K/uL   Monocytes Relative 8 %   Monocytes Absolute 0.6 0.1 - 1.0 K/uL   Eosinophils Relative 3 %   Eosinophils Absolute 0.2 0.0 - 0.5 K/uL   Basophils Relative 0 %   Basophils Absolute 0.0 0.0 - 0.1 K/uL   Immature  Granulocytes 0 %   Abs Immature Granulocytes 0.02 0.00 - 0.07 K/uL   Imaging Studies: Dg Chest 2 View  Result Date: 01/02/2019 CLINICAL DATA:  Cough and hemoptysis today. EXAM: CHEST - 2 VIEW COMPARISON:  05/15/2015 FINDINGS: The heart size and mediastinal contours are within normal limits. Both lungs are clear. The visualized skeletal structures are unremarkable. IMPRESSION: No active cardiopulmonary disease. Electronically Signed   By: Burman Nieves M.D.   On: 01/02/2019 01:54    ED COURSE and MDM  Nursing notes and initial vitals signs, including pulse oximetry, reviewed.  Vitals:   01/02/19 0121 01/02/19 0122  BP: 134/76   Pulse: 84   Resp: 18   Temp: 98.3 F (36.8 C)   TempSrc: Oral   SpO2: 100%   Weight:  79.4 kg  Height:  5\' 5"  (1.651 m)   In the context of productive cough and blood-streaked sputum this most likely represents bleeding due to acute bronchitis.  His chest x-ray is clear without signs of tuberculosis or pneumonia.  He has no hematologic abnormalities.  He was advised to return for worsening hemoptysis, shortness of breath, high fever or other worsening symptoms.  PROCEDURES    ED DIAGNOSES     ICD-10-CM   1. Blood-streaked sputum R04.2        Paula Libra, MD 01/02/19 0221

## 2019-01-02 NOTE — ED Notes (Signed)
ED Provider at bedside. 

## 2019-01-02 NOTE — ED Triage Notes (Addendum)
Dry cough today, states noticed bright red blood 5 times today.

## 2019-01-22 ENCOUNTER — Emergency Department (HOSPITAL_BASED_OUTPATIENT_CLINIC_OR_DEPARTMENT_OTHER)
Admission: EM | Admit: 2019-01-22 | Discharge: 2019-01-22 | Disposition: A | Discharge status: Home / Self Care | Payer: Self-pay | Attending: Emergency Medicine | Admitting: Emergency Medicine

## 2019-01-22 ENCOUNTER — Other Ambulatory Visit: Payer: Self-pay

## 2019-01-22 ENCOUNTER — Encounter (HOSPITAL_BASED_OUTPATIENT_CLINIC_OR_DEPARTMENT_OTHER): Payer: Self-pay | Admitting: Emergency Medicine

## 2019-01-22 DIAGNOSIS — F17210 Nicotine dependence, cigarettes, uncomplicated: Secondary | ICD-10-CM | POA: Insufficient documentation

## 2019-01-22 DIAGNOSIS — R69 Illness, unspecified: Secondary | ICD-10-CM

## 2019-01-22 DIAGNOSIS — J111 Influenza due to unidentified influenza virus with other respiratory manifestations: Secondary | ICD-10-CM | POA: Insufficient documentation

## 2019-01-22 LAB — INFLUENZA PANEL BY PCR (TYPE A & B)
INFLAPCR: POSITIVE — AB
INFLBPCR: NEGATIVE

## 2019-01-22 MED ORDER — NAPROXEN 250 MG PO TABS
500.0000 mg | ORAL_TABLET | Freq: Once | ORAL | Status: AC
  Administered 2019-01-22: 500 mg via ORAL
  Filled 2019-01-22: qty 2

## 2019-01-22 MED ORDER — OSELTAMIVIR PHOSPHATE 75 MG PO CAPS
75.0000 mg | ORAL_CAPSULE | Freq: Two times a day (BID) | ORAL | 0 refills | Status: AC
Start: 2019-01-22 — End: 2019-01-27

## 2019-01-22 MED FILL — OSELTAMIVIR PHOSPHATE 75 MG: 75 | 5 days supply | Qty: 10 | Fill #0

## 2019-01-22 NOTE — ED Triage Notes (Addendum)
Reports cough for the past 2 days with fever which began last night.  States took echinacea and coricidan. Denies contact with covid-19 confirmed patient.  Denies recent travel.  Ambulatory to room in NAD.  Speaking in full sentences without difficulty.

## 2019-01-22 NOTE — ED Provider Notes (Signed)
[name]:  [id number]  Arrival date & time: 01/22/19     Chief Complaint   Fever   History of Present Illness   Mario Luna is a 28 y.o. year-old male with no pertinent past medical history presenting to the ED with chief complaint of fever.  Patient began feeling ill 2 days ago, productive cough, body aches, fever last night and this morning.  Denies travel, denies sick contacts.  Denies chest pain or shortness of breath.  Body aches most noticeable in the right shoulder, right hip area.  Discomfort is mild, worse with motion, constant.  Denies headache or vision change, no neck pain, no abdominal pain, no dysuria, no rash.  Review of Systems  A complete 10 system review of systems was obtained and all systems are negative except as noted in the HPI and PMH.   Patient's Health History   History reviewed. No pertinent past medical history.  Past Surgical History:  Procedure Laterality Date  . TONSILLECTOMY      History reviewed. No pertinent family history.  Social History   Socioeconomic History  . Marital status: Single    Spouse name: Not on file  . Number of children: Not on file  . Years of education: Not on file  . Highest education level: Not on file  Occupational History  . Not on file  Social Needs  . Financial resource strain: Not on file  . Food insecurity:    Worry: Not on file    Inability: Not on file  . Transportation needs:    Medical: Not on file    Non-medical: Not on file  Tobacco Use  . Smoking status: Current Some Day Smoker    Packs/day: 0.50    Years: 5.00    Pack years: 2.50  . Smokeless tobacco: Never Used  Substance and Sexual Activity  . Alcohol use: No  . Drug use: No  . Sexual activity: Not on file  Lifestyle  . Physical activity:    Days per week: Not on file    Minutes per session: Not on file  . Stress: Not on file  Relationships  . Social connections:    Talks on  phone: Not on file    Gets together: Not on file    Attends religious service: Not on file    Active member of club or organization: Not on file    Attends meetings of clubs or organizations: Not on file    Relationship status: Not on file  . Intimate partner violence:    Fear of current or ex partner: Not on file    Emotionally abused: Not on file    Physically abused: Not on file    Forced sexual activity: Not on file  Other Topics Concern  . Not on file  Social History Narrative  . Not on file     Physical Exam  Vital Signs and Nursing Notes reviewed Vitals:   01/22/19 0817  BP: (!) 150/87  Pulse: 100  Resp: 20  Temp: 100.3 F (37.9 C)  SpO2: 100%    CONSTITUTIONAL: Well-appearing, NAD NEURO:  Alert and oriented x 3, no focal deficits, no meningismus EYES:  eyes equal and reactive ENT/NECK:  no LAD, no JVD CARDIO: Regular rate, well-perfused, normal S1 and S2 PULM:  CTAB no wheezing or rhonchi GI/GU:  normal bowel sounds, non-distended, non-tender MSK/SPINE:  No gross deformities, no edema SKIN:  no rash, atraumatic PSYCH:  Appropriate speech and behavior  Diagnostic and Interventional Summary    Labs Reviewed  INFLUENZA PANEL BY PCR (TYPE A & B)    No orders to display    Medications  naproxen (NAPROSYN) tablet 500 mg (500 mg Oral Given 01/22/19 0836)     Procedures Critical Care  ED Course and Medical Decision Making  I have reviewed the triage vital signs and the nursing notes.  Pertinent labs & imaging results that were available during my care of the patient were reviewed by me and considered in my medical decision making (see below for details).  Lungs clear, short duration of illness, little to no concern for pneumonia.  Consistent with viral illness, possibly the flu.  Low risk, no indication for admission, no indication for novel coronavirus testing at this time.  Shoulder and hip with normal range of motion, no increased warmth or erythema,  nothing to suggest septic joint.  Will treat empirically for the flu as well as swab to aid with any return visits to the ED for worsening symptoms.  Advised quarantine at home, provided with instructions.  After the discussed management above, the patient was determined to be safe for discharge.  The patient was in agreement with this plan and all questions regarding their care were answered.  ED return precautions were discussed and the patient will return to the ED with any significant worsening of condition.  Elmer Sow. Pilar Plate, MD [facility] Emergency Medicine [facility] [email or web address]  Final Clinical Impressions(s) / ED Diagnoses     ICD-10-CM   1. Influenza-like illness R69     ED Discharge Orders    None         Sabas Sous, MD 01/22/19 [phone]

## 2019-01-22 NOTE — Discharge Instructions (Addendum)
You were evaluated in the Emergency Department and after careful evaluation, we did not find any emergent condition requiring admission or further testing in the hospital.  Your symptoms today seem to be due to a viral illness possibly the flu.  However given the coronavirus pandemic, we are recommending home isolation to prevent spread of your infection.  If you will not have a test to determine if you are still contagious, you can leave home after these three things have happened:  You have had no fever for at least 72 hours (that is three full days of no fever without the use medicine that reduces fevers) AND other symptoms have improved (for example, when your cough or shortness of breath have improved) AND at least 7 days have passed since your symptoms first appeared  With regard to missed work, you should look into the coronavirus relief package that was recently signed into law to see if you can be reimbursed from this time.  This law was passed yesterday and forms should become available online shortly.  Please return to the Emergency Department if you experience any worsening of your condition such as significant shortness of breath.  We encourage you to follow up with a primary care provider.  Thank you for allowing [place] to be a part of your care.

## 2019-01-23 ENCOUNTER — Other Ambulatory Visit: Payer: Self-pay

## 2019-01-23 ENCOUNTER — Emergency Department (HOSPITAL_BASED_OUTPATIENT_CLINIC_OR_DEPARTMENT_OTHER)
Admission: EM | Admit: 2019-01-23 | Discharge: 2019-01-23 | Disposition: A | Discharge status: Home / Self Care | Payer: Self-pay | Attending: Emergency Medicine | Admitting: Emergency Medicine

## 2019-01-23 ENCOUNTER — Encounter (HOSPITAL_BASED_OUTPATIENT_CLINIC_OR_DEPARTMENT_OTHER): Payer: Self-pay | Admitting: *Deleted

## 2019-01-23 ENCOUNTER — Emergency Department (HOSPITAL_BASED_OUTPATIENT_CLINIC_OR_DEPARTMENT_OTHER): Payer: Self-pay

## 2019-01-23 DIAGNOSIS — F17210 Nicotine dependence, cigarettes, uncomplicated: Secondary | ICD-10-CM | POA: Insufficient documentation

## 2019-01-23 DIAGNOSIS — J181 Lobar pneumonia, unspecified organism: Secondary | ICD-10-CM | POA: Insufficient documentation

## 2019-01-23 DIAGNOSIS — J111 Influenza due to unidentified influenza virus with other respiratory manifestations: Secondary | ICD-10-CM

## 2019-01-23 DIAGNOSIS — J189 Pneumonia, unspecified organism: Secondary | ICD-10-CM

## 2019-01-23 DIAGNOSIS — Z79899 Other long term (current) drug therapy: Secondary | ICD-10-CM | POA: Insufficient documentation

## 2019-01-23 MED ORDER — DOXYCYCLINE HYCLATE 100 MG PO TABS
100.0000 mg | ORAL_TABLET | Freq: Once | ORAL | Status: AC
  Administered 2019-01-23: 100 mg via ORAL
  Filled 2019-01-23: qty 1

## 2019-01-23 MED ORDER — DOXYCYCLINE HYCLATE 100 MG PO CAPS: 100.0000 mg | ORAL_CAPSULE | Freq: Two times a day (BID) | ORAL | 0 refills | Status: AC

## 2019-01-23 MED ORDER — ACETAMINOPHEN 500 MG PO TABS
1000.0000 mg | ORAL_TABLET | Freq: Once | ORAL | Status: AC
  Administered 2019-01-23: 1000 mg via ORAL
  Filled 2019-01-23: qty 2

## 2019-01-23 NOTE — ED Notes (Signed)
Patient transported to X-ray 

## 2019-01-23 NOTE — ED Triage Notes (Signed)
Cough and SOB. Difficulty breathing when he lays down. He had a positive flu test here yesterday. Fever. He has not taken fever reducer since this am.

## 2019-01-23 NOTE — Discharge Instructions (Signed)
You can take Tylenol or Ibuprofen as directed for pain. You can alternate Tylenol and Ibuprofen every 4 hours. If you take Tylenol at 1pm, then you can take Ibuprofen at 5pm. Then you can take Tylenol again at 9pm.   Take antibiotics as directed. Please take all of your antibiotics until finished.  Make sure she is drinking fluids and staying hydrated.   Return to the Emergency Dept for any worsening SOB, CP, abdominal pain, nausea/vomiting or any other worsening or concerning symptoms.

## 2019-01-23 NOTE — ED Provider Notes (Signed)
[facility] EMERGENCY DEPARTMENT Provider Note   CSN: [id number] Arrival date & time: 01/23/19  2200    History   Chief Complaint Chief Complaint  Patient presents with   Shortness of Breath    HPI Mario Luna is a 28 y.o. male who presents the ED for continued fever, generalized body aches, cough and shortness of breath.  Patient was seen in the ED yesterday and was diagnosed with flu.  Patient states that he has continued to have fever and body aches today.  He took 1 dose of Tylenol today but no other.  Patient also reports that this evening, he started developing some shortness of breath, prompting repeat ED visit.  Patient states that he is still coughing and states that is productive with phlegm.  No gross hemoptysis.  Patient states that he feels better now but states he still feels like he is having body aches.  Patient denies any recent travel.  He denies any known exposure to sick contacts or persons diagnosed with coronavirus.  Patient denies any chest pain, difficulty breathing, nausea/vomiting.     The history is provided by the patient.    History reviewed. No pertinent past medical history.  There are no active problems to display for this patient.   Past Surgical History:  Procedure Laterality Date   TONSILLECTOMY          Home Medications    Prior to Admission medications   Medication Sig Start Date End Date Taking? Authorizing Provider  ipratropium (ATROVENT) 0.06 % nasal spray Place 2 sprays into both nostrils 4 (four) times daily. 06/23/17  Yes Dorena Bodo, NP  oseltamivir (TAMIFLU) 75 MG capsule Take 1 capsule (75 mg total) by mouth every 12 (twelve) hours for 5 days. 01/22/19 01/27/19 Yes Bero, Elmer Sow, MD  doxycycline (VIBRAMYCIN) 100 MG capsule Take 1 capsule (100 mg total) by mouth 2 (two) times daily for 7 days. 01/23/19 01/30/19  Maxwell Caul, PA-C    Family History No family history on file.  Social History Social  History   Tobacco Use   Smoking status: Current Some Day Smoker    Packs/day: 0.50    Years: 5.00    Pack years: 2.50   Smokeless tobacco: Never Used  Substance Use Topics   Alcohol use: No   Drug use: No     Allergies   Patient has no known allergies.   Review of Systems Review of Systems  Constitutional: Positive for fever.  Respiratory: Positive for cough and shortness of breath.   Cardiovascular: Negative for chest pain.  Gastrointestinal: Negative for abdominal pain, nausea and vomiting.  Genitourinary: Negative for dysuria and hematuria.  Musculoskeletal: Positive for myalgias.  Neurological: Negative for headaches.  All other systems reviewed and are negative.    Physical Exam Updated Vital Signs BP 119/75    Pulse 85    Temp 98.4 F (36.9 C) (Oral)    Resp 18    Ht  (1.651 m)    Wt 77.1 kg    SpO2 100%    BMI 28.29 kg/m   Physical Exam Vitals signs and nursing note reviewed.  Constitutional:      Appearance: He is well-developed.  HENT:     Head: Normocephalic and atraumatic.     Nose: Congestion present.  Eyes:     General: No scleral icterus.       Right eye: No discharge.        Left eye: No discharge.  Conjunctiva/sclera: Conjunctivae normal.  Pulmonary:     Effort: Pulmonary effort is normal.     Breath sounds: Normal breath sounds.     Comments: Lungs clear to auscultation bilaterally.  Symmetric chest rise.  No wheezing, rales, rhonchi.  Able to speak in full sentences without any difficulty. Skin:    General: Skin is warm and dry.  Neurological:     Mental Status: He is alert.  Psychiatric:        Speech: Speech normal.        Behavior: Behavior normal.      ED Treatments / Results  Labs (all labs ordered are listed, but only abnormal results are displayed) Labs Reviewed - No data to display  EKG None  Radiology Dg Chest 2 View  Result Date: 01/23/2019 CLINICAL DATA:  Cough and congestion for 4 days, fever and  chest pain with body aches since yesterday, smoker EXAM: CHEST - 2 VIEW COMPARISON:  01/02/2019 FINDINGS: Normal heart size, mediastinal contours, and pulmonary vascularity. Minimal chronic bronchitic changes. Increased markings at RIGHT base suspect subtle RIGHT lower lobe pneumonia. Remaining lungs clear. No pleural effusion or pneumothorax. Bones unremarkable. IMPRESSION: Bronchitic changes with suspect subtle RIGHT lower lobe pneumonia. Electronically Signed   By: Ulyses Southward M.D.   On: 01/23/2019 23:13    Procedures Procedures (including critical care time)  Medications Ordered in ED Medications  doxycycline (VIBRA-TABS) tablet 100 mg (has no administration in time range)  acetaminophen (TYLENOL) tablet 1,000 mg (1,000 mg Oral Given 01/23/19 2246)     Initial Impression / Assessment and Plan / ED Course  I have reviewed the triage vital signs and the nursing notes.  Pertinent labs & imaging results that were available during my care of the patient were reviewed by me and considered in my medical decision making (see chart for details).        28 year old male who presents for evaluation of continued fevers, generalized body aches and shortness of breath that began evening.  Patient states that he was recently diagnosed with flu yesterday.  He states that he is continued to have generalized body aches, fever.  He took 1 dose of Tylenol today but otherwise has not been taking any other medications.  Reports that tonight, he started building some shortness of breath and came back to the ED.  He states shortness of breath has improved but states that he was worried. Patient is afebrile, non-toxic appearing, sitting comfortably on examination table. Vital signs reviewed and stable.  Lungs clear to auscultation.  I discussed with patient that his persistent fever and generalized body aches are consistent with continued influenza virus.  I discussed with him that this could last anywhere between 5  to 10 days.  Lungs clear to auscultation on my exam but given shortness of breath, will plan for chest x-ray for evaluation as there was no imaging done yesterday.  At this time, patient does not meet meet criteria for testing for COVID-19.  X-ray reviewed.  There is bronchitic changes with suspected subtle right lower lobe pneumonia.  Shortness of breath, cough will plan to treat.  Discussed results with patient.  Patient with no known drug allergies.  We will plan to start him on antibiotics.  Encouraged at home supportive care measures. At this time, patient exhibits no emergent life-threatening condition that require further evaluation in ED or admission. Patient had ample opportunity for questions and discussion. All patient's questions were answered with full understanding. Strict return precautions discussed.  Patient expresses understanding and agreement to plan.   Portions of this note were generated with Scientist, clinical (histocompatibility and immunogenetics). Dictation errors may occur despite best attempts at proofreading.    Final Clinical Impressions(s) / ED Diagnoses   Final diagnoses:  Community acquired pneumonia of right lower lobe of lung (HCC)  Influenza    ED Discharge Orders         Ordered    doxycycline (VIBRAMYCIN) 100 MG capsule  2 times daily     01/23/19 2326           Rosana Hoes 01/23/19 2334    Little, Ambrose Finland, MD 01/25/19 1201

## 2019-05-28 ENCOUNTER — Other Ambulatory Visit: Payer: Self-pay

## 2019-05-28 ENCOUNTER — Emergency Department (HOSPITAL_BASED_OUTPATIENT_CLINIC_OR_DEPARTMENT_OTHER)
Admission: EM | Admit: 2019-05-28 | Discharge: 2019-05-28 | Disposition: A | Discharge status: Home / Self Care | Payer: No Typology Code available for payment source | Attending: Emergency Medicine | Admitting: Emergency Medicine

## 2019-05-28 ENCOUNTER — Emergency Department (HOSPITAL_BASED_OUTPATIENT_CLINIC_OR_DEPARTMENT_OTHER): Payer: No Typology Code available for payment source

## 2019-05-28 ENCOUNTER — Encounter (HOSPITAL_BASED_OUTPATIENT_CLINIC_OR_DEPARTMENT_OTHER): Payer: Self-pay | Admitting: *Deleted

## 2019-05-28 DIAGNOSIS — S0990XA Unspecified injury of head, initial encounter: Secondary | ICD-10-CM | POA: Diagnosis not present

## 2019-05-28 DIAGNOSIS — Y998 Other external cause status: Secondary | ICD-10-CM | POA: Insufficient documentation

## 2019-05-28 DIAGNOSIS — Y92411 Interstate highway as the place of occurrence of the external cause: Secondary | ICD-10-CM | POA: Insufficient documentation

## 2019-05-28 DIAGNOSIS — Y9389 Activity, other specified: Secondary | ICD-10-CM | POA: Insufficient documentation

## 2019-05-28 DIAGNOSIS — R0789 Other chest pain: Secondary | ICD-10-CM | POA: Diagnosis not present

## 2019-05-28 DIAGNOSIS — F17210 Nicotine dependence, cigarettes, uncomplicated: Secondary | ICD-10-CM | POA: Diagnosis not present

## 2019-05-28 DIAGNOSIS — S29019A Strain of muscle and tendon of unspecified wall of thorax, initial encounter: Secondary | ICD-10-CM | POA: Diagnosis not present

## 2019-05-28 MED ORDER — KETOROLAC TROMETHAMINE 30 MG/ML IJ SOLN
30.0000 mg | Freq: Once | INTRAMUSCULAR | Status: AC
  Administered 2019-05-28: 30 mg via INTRAMUSCULAR
  Filled 2019-05-28: qty 1

## 2019-05-28 MED ORDER — IBUPROFEN 800 MG PO TABS: 800.0000 mg | ORAL_TABLET | Freq: Three times a day (TID) | ORAL | 0 refills | Status: DC | PRN

## 2019-05-28 MED ORDER — DICLOFENAC SODIUM 1 % TD GEL: 2.0000 g | Freq: Four times a day (QID) | TRANSDERMAL | 0 refills | Status: DC | PRN

## 2019-05-28 MED ORDER — METHOCARBAMOL 500 MG PO TABS: 500.0000 mg | ORAL_TABLET | Freq: Three times a day (TID) | ORAL | 0 refills | Status: DC | PRN

## 2019-05-28 NOTE — Discharge Instructions (Signed)
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
## 2019-05-28 NOTE — ED Notes (Signed)
ED Provider at bedside. 

## 2019-05-30 ENCOUNTER — Other Ambulatory Visit: Payer: Self-pay

## 2019-05-30 ENCOUNTER — Emergency Department (HOSPITAL_BASED_OUTPATIENT_CLINIC_OR_DEPARTMENT_OTHER)
Admission: EM | Admit: 2019-05-30 | Discharge: 2019-05-30 | Disposition: A | Discharge status: Home / Self Care | Payer: No Typology Code available for payment source | Attending: Emergency Medicine | Admitting: Emergency Medicine

## 2019-05-30 ENCOUNTER — Encounter (HOSPITAL_BASED_OUTPATIENT_CLINIC_OR_DEPARTMENT_OTHER): Payer: Self-pay | Admitting: Emergency Medicine

## 2019-05-30 ENCOUNTER — Emergency Department (HOSPITAL_BASED_OUTPATIENT_CLINIC_OR_DEPARTMENT_OTHER): Payer: No Typology Code available for payment source

## 2019-05-30 ENCOUNTER — Encounter (HOSPITAL_BASED_OUTPATIENT_CLINIC_OR_DEPARTMENT_OTHER): Payer: Self-pay

## 2019-05-30 ENCOUNTER — Emergency Department (HOSPITAL_BASED_OUTPATIENT_CLINIC_OR_DEPARTMENT_OTHER)
Admission: EM | Admit: 2019-05-30 | Discharge: 2019-05-30 | Disposition: A | Discharge status: Home / Self Care | Payer: Self-pay | Attending: Emergency Medicine | Admitting: Emergency Medicine

## 2019-05-30 DIAGNOSIS — F17210 Nicotine dependence, cigarettes, uncomplicated: Secondary | ICD-10-CM | POA: Diagnosis not present

## 2019-05-30 DIAGNOSIS — F17290 Nicotine dependence, other tobacco product, uncomplicated: Secondary | ICD-10-CM | POA: Insufficient documentation

## 2019-05-30 DIAGNOSIS — F419 Anxiety disorder, unspecified: Secondary | ICD-10-CM | POA: Insufficient documentation

## 2019-05-30 DIAGNOSIS — R079 Chest pain, unspecified: Secondary | ICD-10-CM | POA: Diagnosis not present

## 2019-05-30 DIAGNOSIS — Z79899 Other long term (current) drug therapy: Secondary | ICD-10-CM | POA: Insufficient documentation

## 2019-05-30 DIAGNOSIS — M5489 Other dorsalgia: Secondary | ICD-10-CM | POA: Diagnosis not present

## 2019-05-30 MED ORDER — LORAZEPAM 1 MG PO TABS
1.0000 mg | ORAL_TABLET | Freq: Once | ORAL | Status: AC
  Administered 2019-05-30: 1 mg via ORAL
  Filled 2019-05-30: qty 1

## 2019-05-30 MED ORDER — HYDROXYZINE HCL 25 MG PO TABS: 25.0000 mg | ORAL_TABLET | Freq: Four times a day (QID) | ORAL | 0 refills | Status: AC

## 2019-05-30 MED ORDER — HYDROCODONE-ACETAMINOPHEN 5-325 MG PO TABS: 1.0000 | ORAL_TABLET | Freq: Four times a day (QID) | ORAL | 0 refills | Status: DC | PRN

## 2019-05-30 MED ORDER — HYDROXYZINE HCL 25 MG PO TABS: 25.0000 mg | ORAL_TABLET | Freq: Four times a day (QID) | ORAL | 0 refills | Status: DC
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
## 2019-05-30 NOTE — ED Notes (Signed)
Pt reports he feels very anxious. Pt speaking fast. Pt requesting something to help him sleep.
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
## 2019-05-30 NOTE — ED Notes (Signed)
Patient pacing in room; states he feels like he cant breath; o2 sat 100%; nad noted; talked with patient to help him calm down.
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
## 2019-05-30 NOTE — Discharge Instructions (Addendum)
Take medications as prescribed.  Return for new or worsening symptoms. 

## 2019-05-31 ENCOUNTER — Other Ambulatory Visit: Payer: Self-pay

## 2019-05-31 ENCOUNTER — Emergency Department (HOSPITAL_BASED_OUTPATIENT_CLINIC_OR_DEPARTMENT_OTHER)
Admission: EM | Admit: 2019-05-31 | Discharge: 2019-05-31 | Disposition: A | Discharge status: Home / Self Care | Payer: Self-pay | Attending: Emergency Medicine | Admitting: Emergency Medicine

## 2019-05-31 ENCOUNTER — Encounter (HOSPITAL_BASED_OUTPATIENT_CLINIC_OR_DEPARTMENT_OTHER): Payer: Self-pay | Admitting: Emergency Medicine

## 2019-05-31 DIAGNOSIS — F17210 Nicotine dependence, cigarettes, uncomplicated: Secondary | ICD-10-CM | POA: Insufficient documentation

## 2019-05-31 DIAGNOSIS — F410 Panic disorder [episodic paroxysmal anxiety] without agoraphobia: Secondary | ICD-10-CM | POA: Insufficient documentation

## 2019-05-31 DIAGNOSIS — F430 Acute stress reaction: Secondary | ICD-10-CM | POA: Insufficient documentation

## 2019-05-31 DIAGNOSIS — Z79899 Other long term (current) drug therapy: Secondary | ICD-10-CM | POA: Insufficient documentation

## 2019-05-31 MED ORDER — LORAZEPAM 1 MG PO TABS
2.0000 mg | ORAL_TABLET | Freq: Once | ORAL | Status: AC
  Administered 2019-05-31: 13:00:00 2 mg via ORAL
  Filled 2019-05-31: qty 2

## 2019-05-31 MED ORDER — LORAZEPAM 1 MG PO TABS: 1.0000 mg | ORAL_TABLET | Freq: Three times a day (TID) | ORAL | 0 refills | Status: DC | PRN

## 2019-05-31 NOTE — ED Triage Notes (Signed)
C/o worsening anxiety. He is hyperventilating. Seen yesterday for same. States he filled his rx and took the meds last night.

## 2019-05-31 NOTE — ED Notes (Signed)
Pt verbalizes understanding of D/C instructions and has no further questions

## 2019-05-31 NOTE — ED Notes (Signed)
ED Provider at bedside. 

## 2019-05-31 NOTE — Discharge Instructions (Signed)
Stop taking the hydrocodone and muscle relaxer.  You can take the hydroxyzine for anxiety as well as taking the Lorazepam as needed up to 3 times a day for the next week or so to help with the anxiety related to the stress of your recent loss.
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
## 2019-06-01 ENCOUNTER — Ambulatory Visit (HOSPITAL_COMMUNITY): Payer: Self-pay

## 2019-06-01 ENCOUNTER — Telehealth (HOSPITAL_COMMUNITY): Payer: Self-pay | Admitting: Genetic Counselor

## 2019-06-01 NOTE — Telephone Encounter (Signed)
I called Mr. Tendler to answer his questions re: alpha-thalassemia carrier screening. We discussed that since his partner was identified to be a silent carrier for alpha-thalassemia (aa/a-), it is recommended that he be tested to determine if he is also a carrier. If he is found to be a carrier of alpha-thalassemia in the cis configuration (aa/--), then there would be a 25% chance that the current pregnancy and all future pregnancies with his partner could be affected by alpha-thalassemia (hemoglobin H disease). If he is found to be a silent carrier of alpha-thalassemia or a carrier in the trans configuration (a-/a-), the pregnancy would not be at risk for hemoglobin H disease. Hemoglobin H disease can cause anemia, jaundice, bone changes, and hepatosplenomegaly and could require blood transfusions. We discussed that without performing carrier screening on him, he has a 1 in 30 chance of being a carrier for alpha-thalassemia based on his ethnicity alone; thus, the pregnancy currently has around a 1 in 120 chance of being affected by hemoglobin H disease. We also discussed that if he were identified as a carrier, we could test the pregnancy for alpha-thalassemia via amniocentesis or perform postnatal genetic testing on the baby after birth. Mr. Sharp indicated that he does not have insurance so he would require some time to save up for carrier screening should he decide to pursue it. He currently has a laboratory appointment scheduled at the [facility] on 06/09/19; however, he will likely reschedule this appointment. I mentioned that we could pursue carrier screening through the laboratory [facility], who has a patient pay price of $250 as well as patient financial assistance programs if he is interested. He expressed interest in this option. He is going to discuss carrier screening with his partner and make a decision regarding whether or not he will undergo testing depending on their level of  anxiety for the current pregnancy as well as cost considerations. I provided my direct phone number for Mr. Spitler should he have any other questions.   Buelah Manis, MS Genetic Counselor

## 2019-06-04 ENCOUNTER — Telehealth: Payer: Self-pay | Admitting: Surgery

## 2019-06-04 NOTE — Telephone Encounter (Signed)
ED CM received call from patient and mother regarding f/u.  Patient unable to find providers  without up to a 30 day wait. Patient is requesting assistance with follow up. ED CM reached to [facility] CSW to assist. She will contact the patient and schedule a follow up appointment. CM provided patient with the Clinic information as well. No further ED CM needs identified.

## 2019-06-05 ENCOUNTER — Emergency Department (HOSPITAL_BASED_OUTPATIENT_CLINIC_OR_DEPARTMENT_OTHER)
Admission: EM | Admit: 2019-06-05 | Discharge: 2019-06-05 | Disposition: A | Discharge status: Home / Self Care | Payer: Self-pay | Attending: Emergency Medicine | Admitting: Emergency Medicine

## 2019-06-05 ENCOUNTER — Encounter (HOSPITAL_BASED_OUTPATIENT_CLINIC_OR_DEPARTMENT_OTHER): Payer: Self-pay | Admitting: *Deleted

## 2019-06-05 ENCOUNTER — Other Ambulatory Visit: Payer: Self-pay

## 2019-06-05 DIAGNOSIS — F17290 Nicotine dependence, other tobacco product, uncomplicated: Secondary | ICD-10-CM | POA: Insufficient documentation

## 2019-06-05 DIAGNOSIS — F410 Panic disorder [episodic paroxysmal anxiety] without agoraphobia: Secondary | ICD-10-CM | POA: Insufficient documentation

## 2019-06-05 HISTORY — DX: Anxiety disorder, unspecified: F41.9

## 2019-06-05 MED ORDER — LORAZEPAM 1 MG PO TABS: 1.0000 mg | ORAL_TABLET | Freq: Three times a day (TID) | ORAL | 0 refills | Status: DC | PRN

## 2019-06-05 NOTE — ED Triage Notes (Signed)
Pt c/o anxiety and states he is out of his ativan .
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
## 2019-06-06 ENCOUNTER — Emergency Department (HOSPITAL_BASED_OUTPATIENT_CLINIC_OR_DEPARTMENT_OTHER)
Admission: EM | Admit: 2019-06-06 | Discharge: 2019-06-07 | Disposition: A | Discharge status: Home / Self Care | Payer: Self-pay | Attending: Emergency Medicine | Admitting: Emergency Medicine

## 2019-06-06 ENCOUNTER — Other Ambulatory Visit: Payer: Self-pay

## 2019-06-06 ENCOUNTER — Encounter (HOSPITAL_BASED_OUTPATIENT_CLINIC_OR_DEPARTMENT_OTHER): Payer: Self-pay | Admitting: *Deleted

## 2019-06-06 DIAGNOSIS — F419 Anxiety disorder, unspecified: Secondary | ICD-10-CM | POA: Insufficient documentation

## 2019-06-06 DIAGNOSIS — F17210 Nicotine dependence, cigarettes, uncomplicated: Secondary | ICD-10-CM | POA: Insufficient documentation

## 2019-06-06 DIAGNOSIS — F410 Panic disorder [episodic paroxysmal anxiety] without agoraphobia: Secondary | ICD-10-CM | POA: Insufficient documentation

## 2019-06-06 NOTE — ED Triage Notes (Signed)
Pt reports anxiety x 45 minutes. He has tried CBD oil and hydroxyzine without relief

## 2019-06-07 MED ORDER — LORAZEPAM 2 MG/ML IJ SOLN
1.0000 mg | Freq: Once | INTRAMUSCULAR | Status: AC
  Administered 2019-06-07: 1 mg via INTRAMUSCULAR
  Filled 2019-06-07: qty 1
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
## 2019-06-07 NOTE — ED Notes (Signed)
Patient noted to be anxious.  He is pacing around the room.  Encouraged to take slow deep breaths.  Family member at the bedside.

## 2019-06-08 ENCOUNTER — Emergency Department (HOSPITAL_BASED_OUTPATIENT_CLINIC_OR_DEPARTMENT_OTHER): Payer: Self-pay

## 2019-06-08 ENCOUNTER — Encounter (HOSPITAL_BASED_OUTPATIENT_CLINIC_OR_DEPARTMENT_OTHER): Payer: Self-pay | Admitting: Emergency Medicine

## 2019-06-08 ENCOUNTER — Other Ambulatory Visit: Payer: Self-pay

## 2019-06-08 ENCOUNTER — Emergency Department (HOSPITAL_BASED_OUTPATIENT_CLINIC_OR_DEPARTMENT_OTHER)
Admission: EM | Admit: 2019-06-08 | Discharge: 2019-06-08 | Disposition: A | Discharge status: Home / Self Care | Payer: Self-pay | Attending: Emergency Medicine | Admitting: Emergency Medicine

## 2019-06-08 ENCOUNTER — Encounter: Payer: Self-pay | Admitting: Internal Medicine

## 2019-06-08 DIAGNOSIS — F419 Anxiety disorder, unspecified: Secondary | ICD-10-CM | POA: Insufficient documentation

## 2019-06-08 DIAGNOSIS — R0789 Other chest pain: Secondary | ICD-10-CM | POA: Insufficient documentation

## 2019-06-08 DIAGNOSIS — F17290 Nicotine dependence, other tobacco product, uncomplicated: Secondary | ICD-10-CM | POA: Insufficient documentation

## 2019-06-08 DIAGNOSIS — F410 Panic disorder [episodic paroxysmal anxiety] without agoraphobia: Secondary | ICD-10-CM | POA: Insufficient documentation

## 2019-06-08 LAB — CBC WITH DIFFERENTIAL/PLATELET
Abs Immature Granulocytes: 0.01 10*3/uL (ref 0.00–0.07)
Basophils Absolute: 0.1 10*3/uL (ref 0.0–0.1)
Basophils Relative: 1 %
Eosinophils Absolute: 0.2 10*3/uL (ref 0.0–0.5)
Eosinophils Relative: 2 %
HCT: 46.7 % (ref 39.0–52.0)
Hemoglobin: 16.2 g/dL (ref 13.0–17.0)
Immature Granulocytes: 0 %
Lymphocytes Relative: 53 %
Lymphs Abs: 4.6 10*3/uL — ABNORMAL HIGH (ref 0.7–4.0)
MCH: 31.4 pg (ref 26.0–34.0)
MCHC: 34.7 g/dL (ref 30.0–36.0)
MCV: 90.5 fL (ref 80.0–100.0)
Monocytes Absolute: 1 10*3/uL (ref 0.1–1.0)
Monocytes Relative: 12 %
Neutro Abs: 2.7 10*3/uL (ref 1.7–7.7)
Neutrophils Relative %: 32 %
Platelets: 267 10*3/uL (ref 150–400)
RBC: 5.16 MIL/uL (ref 4.22–5.81)
RDW: 12.4 % (ref 11.5–15.5)
WBC: 8.6 10*3/uL (ref 4.0–10.5)
nRBC: 0 % (ref 0.0–0.2)

## 2019-06-08 LAB — BASIC METABOLIC PANEL
Anion gap: 11 (ref 5–15)
BUN: 11 mg/dL (ref 6–20)
CO2: 23 mmol/L (ref 22–32)
Calcium: 9.4 mg/dL (ref 8.9–10.3)
Chloride: 103 mmol/L (ref 98–111)
Creatinine, Ser: 1.03 mg/dL (ref 0.61–1.24)
GFR calc Af Amer: >60 mL/min (ref >60)
GFR calc non Af Amer: >60 mL/min (ref >60)
Glucose, Bld: 82 mg/dL (ref 70–99)
Potassium: 3.7 mmol/L (ref 3.5–5.1)
Sodium: 137 mmol/L (ref 135–145)

## 2019-06-08 MED ORDER — HYDROXYZINE HCL 25 MG PO TABS
25.0000 mg | ORAL_TABLET | Freq: Once | ORAL | Status: AC
  Administered 2019-06-08: 25 mg via ORAL
  Filled 2019-06-08: qty 1

## 2019-06-08 MED ORDER — KETOROLAC TROMETHAMINE 15 MG/ML IJ SOLN
15.0000 mg | Freq: Once | INTRAMUSCULAR | Status: AC
  Administered 2019-06-08: 15 mg via INTRAVENOUS
  Filled 2019-06-08: qty 1

## 2019-06-08 MED ORDER — HYDROXYZINE HCL 25 MG PO TABS: 25.0000 mg | ORAL_TABLET | Freq: Four times a day (QID) | ORAL | 0 refills | Status: DC | PRN

## 2019-06-08 NOTE — ED Provider Notes (Signed)
[facility] EMERGENCY DEPARTMENT Provider Note   CSN: [id number] Arrival date & time: 06/08/19  1903    History   Chief Complaint Chief Complaint  Patient presents with  . Chest Pain  . Anxiety    HPI Mario Luna is a 28 y.o. male.     The history is provided by the patient, a parent and medical records. No language interpreter was used.  Chest Pain Associated symptoms: anxiety   Anxiety Associated symptoms include chest pain.   Mario Luna is a 28 y.o. male who presents to the Emergency Department complaining of chest pain. He presents to the emergency department complaining of chest pain and anxiety. He has developed anxiety since the passing of his father a week and a half ago. Today he uses therapy around 3 PM and developed anxiety that he describes as a panic attack. Then around 5 o'clock he developed left-sided chest pain described as sharp in nature. He took one of his Ativan at home, which helped the pain subsided but he does have ongoing and left-sided chest pain. He denies any fevers, nausea, vomiting, shortness of breath. He has been prescribed both hydroxyzine and lorazepam from the emergency department for these panic attacks. He currently only has one lorazepam left. He feels minimal relief in symptoms with the hydroxyzine. He has no medical problems and takes no additional medications. He stopped smoking five days ago. No alcohol or drug use. Past Medical History:  Diagnosis Date  . A-fib ([facility])   . Anxiety   . Heart murmur   . History of palpitations     There are no active problems to display for this patient.   Past Surgical History:  Procedure Laterality Date  . ADENOIDECTOMY    . TONSILLECTOMY          Home Medications    Prior to Admission medications   Medication Sig Start Date End Date Taking? Authorizing Provider  diclofenac sodium (VOLTAREN) 1 % GEL Apply 2 g topically 4 (four) times daily as needed. 05/28/19   Long, Wonda Olds, MD   HYDROcodone-acetaminophen (NORCO) 5-325 MG tablet Take 1 tablet by mouth every 6 (six) hours as needed (for pain). 05/30/19   Molpus, John, MD  hydrOXYzine (ATARAX/VISTARIL) 25 MG tablet Take 1 tablet (25 mg total) by mouth every 6 (six) hours as needed. 06/08/19   Quintella Reichert, MD  ibuprofen (ADVIL) 800 MG tablet Take 1 tablet (800 mg total) by mouth every 8 (eight) hours as needed for moderate pain. 05/28/19   Long, Wonda Olds, MD  LORazepam (ATIVAN) 1 MG tablet Take 1 tablet (1 mg total) by mouth 3 (three) times daily as needed for anxiety. 06/05/19   Malvin Johns, MD  methocarbamol (ROBAXIN) 500 MG tablet Take 1 tablet (500 mg total) by mouth every 8 (eight) hours as needed for muscle spasms. 05/28/19   Long, Wonda Olds, MD    Family History Family History  Adopted: Yes    Social History Social History   Tobacco Use  . Smoking status: Current Some Day Smoker    Packs/day: 0.50    Years: 5.00    Pack years: 2.50    Types: Cigars  . Smokeless tobacco: Never Used  Substance Use Topics  . Alcohol use: Not Currently  . Drug use: No     Allergies   Patient has no known allergies.   Review of Systems Review of Systems  Cardiovascular: Positive for chest pain.  All other systems reviewed and are  negative.    Physical Exam Updated Vital Signs BP (!) 138/92 (BP Location: Right Arm)   Pulse 68   Temp 98.5 F (36.9 C) (Oral)   Resp 17   Ht 5\' 5"  (1.651 m)   Wt 77.1 kg   SpO2 98%   BMI 28.29 kg/m   Physical Exam Vitals signs and nursing note reviewed.  Constitutional:      Appearance: He is well-developed.  HENT:     Head: Normocephalic and atraumatic.  Cardiovascular:     Rate and Rhythm: Normal rate and regular rhythm.     Heart sounds: No murmur.  Pulmonary:     Effort: Pulmonary effort is normal. No respiratory distress.     Breath sounds: Normal breath sounds.  Chest:     Chest wall: Tenderness present.  Abdominal:     Palpations: Abdomen is soft.      Tenderness: There is no abdominal tenderness. There is no guarding or rebound.  Musculoskeletal:        General: No swelling or tenderness.  Skin:    General: Skin is warm and dry.  Neurological:     Mental Status: He is alert and oriented to person, place, and time.  Psychiatric:        Behavior: Behavior normal.      ED Treatments / Results  Labs (all labs ordered are listed, but only abnormal results are displayed) Labs Reviewed  CBC WITH DIFFERENTIAL/PLATELET - Abnormal; Notable for the following components:      Result Value   Lymphs Abs 4.6 (*)    All other components within normal limits  BASIC METABOLIC PANEL    EKG EKG Interpretation  Date/Time:  Monday June 08 2019 19:12:15 EDT Ventricular Rate:  84 PR Interval:  134 QRS Duration: 100 QT Interval:  346 QTC Calculation: 408 R Axis:   85 Text Interpretation:  Normal sinus rhythm with sinus arrhythmia Normal ECG Confirmed by Tilden Fossaees, Kahli Fitzgerald [phone]([id number]) on 06/08/2019 9:27:41 PM   Radiology Dg Chest 2 View  Result Date: 06/08/2019 CLINICAL DATA:  Left-sided chest pain for several hours EXAM: CHEST - 2 VIEW COMPARISON:  05/28/2019, 05/30/2019 CT FINDINGS: The heart size and mediastinal contours are within normal limits. Both lungs are clear. The visualized skeletal structures are unremarkable. IMPRESSION: No active cardiopulmonary disease. Electronically Signed   By: Alcide CleverMark  Lukens M.D.   On: 06/08/2019 23:15    Procedures Procedures (including critical care time)  Medications Ordered in ED Medications  ketorolac (TORADOL) 15 MG/ML injection 15 mg (15 mg Intravenous Given 06/08/19 2244)  hydrOXYzine (ATARAX/VISTARIL) tablet 25 mg (25 mg Oral Given 06/08/19 2246)     Initial Impression / Assessment and Plan / ED Course  I have reviewed the triage vital signs and the nursing notes.  Pertinent labs & imaging results that were available during my care of the patient were reviewed by me and considered in my medical  decision making (see chart for details).        Patient here for evaluation of chest pain, anxiety. Anxiety is been present since his father passed away a week and a half ago. His chest pain began today after a panic attack. EKG without acute ischemic changes. Presentation is not consistent with PE, PERC negative. Pain has some reproducible components as well as components that are not reproducible. Labs are reassuring. Discussed with patient home care for anxiety and panic attack. Discussed home coping allergies. Discussed that lorazepam is not an ideal medication for his anxiety,  recommend hydroxyzine with therapy follow-up. Discussed outpatient follow-up and return precautions. Final Clinical Impressions(s) / ED Diagnoses   Final diagnoses:  Atypical chest pain  Anxiety attack    ED Discharge Orders         Ordered    hydrOXYzine (ATARAX/VISTARIL) 25 MG tablet  Every 6 hours PRN     06/08/19 2327           Tilden Fossaees, Chrysa Rampy, MD 06/08/19 2330

## 2019-06-08 NOTE — ED Triage Notes (Signed)
Pt seen here on 8/2 for the same.
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
## 2019-06-08 NOTE — ED Notes (Signed)
ED Provider at bedside. 

## 2019-06-08 NOTE — ED Triage Notes (Signed)
Anxiety today. Took ativan at 1740 and felt a sharp pain in chest. Pt also states he only has 1 ativan left. Pt also states he needs a primary care MD and has appt. 8/11. Denies chest pain at present. VSS.

## 2019-06-09 ENCOUNTER — Other Ambulatory Visit (HOSPITAL_COMMUNITY): Payer: Self-pay

## 2019-06-10 ENCOUNTER — Encounter (HOSPITAL_BASED_OUTPATIENT_CLINIC_OR_DEPARTMENT_OTHER): Payer: Self-pay | Admitting: Emergency Medicine

## 2019-06-10 ENCOUNTER — Other Ambulatory Visit: Payer: Self-pay

## 2019-06-10 ENCOUNTER — Emergency Department (HOSPITAL_BASED_OUTPATIENT_CLINIC_OR_DEPARTMENT_OTHER)
Admission: EM | Admit: 2019-06-10 | Discharge: 2019-06-10 | Disposition: A | Discharge status: Home / Self Care | Payer: Self-pay | Attending: Emergency Medicine | Admitting: Emergency Medicine

## 2019-06-10 DIAGNOSIS — F419 Anxiety disorder, unspecified: Secondary | ICD-10-CM | POA: Insufficient documentation

## 2019-06-10 DIAGNOSIS — F17290 Nicotine dependence, other tobacco product, uncomplicated: Secondary | ICD-10-CM | POA: Insufficient documentation

## 2019-06-10 MED ORDER — HALOPERIDOL 5 MG PO TABS
5.0000 mg | ORAL_TABLET | ORAL | Status: AC
  Administered 2019-06-10: 5 mg via ORAL

## 2019-06-10 MED ORDER — HALOPERIDOL 5 MG PO TABS
ORAL_TABLET | ORAL | Status: AC
  Administered 2019-06-10: 03:00:00 5 mg via ORAL
  Filled 2019-06-10: qty 1

## 2019-06-10 NOTE — ED Provider Notes (Signed)
[facility] EMERGENCY DEPARTMENT Provider Note   CSN: [id number] Arrival date & time: 06/10/19  0226     History   Chief Complaint Chief Complaint  Patient presents with  . Anxiety    HPI Jenne CampusLarry Mckendry is a 28 y.o. male.     The history is provided by the patient.  Anxiety This is a chronic problem. The current episode started more than 1 week ago. The problem occurs constantly. The problem has not changed since onset.Pertinent negatives include no abdominal pain, no headaches and no shortness of breath. Nothing aggravates the symptoms. Nothing relieves the symptoms. Treatments tried: atarax. The treatment provided no relief.  Patient with known anxiety not in therapy nor on SSRI presents with same.  Seen on the evening of 8/3 and prescribed atarax but states this is not working and he can't sleep.  Palms sweat, chest feels tight.  No cough,  No f/c/r.  No covid exposure.  Recent normal CXR.     Past Medical History:  Diagnosis Date  . A-fib ([facility])   . Anxiety   . Heart murmur   . History of palpitations     There are no active problems to display for this patient.   Past Surgical History:  Procedure Laterality Date  . ADENOIDECTOMY    . TONSILLECTOMY          Home Medications    Prior to Admission medications   Medication Sig Start Date End Date Taking? Authorizing Provider  diclofenac sodium (VOLTAREN) 1 % GEL Apply 2 g topically 4 (four) times daily as needed. 05/28/19   Long, Arlyss RepressJoshua G, MD  HYDROcodone-acetaminophen (NORCO) 5-325 MG tablet Take 1 tablet by mouth every 6 (six) hours as needed (for pain). 05/30/19   Molpus, John, MD  hydrOXYzine (ATARAX/VISTARIL) 25 MG tablet Take 1 tablet (25 mg total) by mouth every 6 (six) hours as needed. 06/08/19   Tilden Fossaees, Elizabeth, MD  ibuprofen (ADVIL) 800 MG tablet Take 1 tablet (800 mg total) by mouth every 8 (eight) hours as needed for moderate pain. 05/28/19   Long, Arlyss RepressJoshua G, MD  LORazepam (ATIVAN) 1 MG tablet Take 1  tablet (1 mg total) by mouth 3 (three) times daily as needed for anxiety. 06/05/19   Rolan BuccoBelfi, Melanie, MD  methocarbamol (ROBAXIN) 500 MG tablet Take 1 tablet (500 mg total) by mouth every 8 (eight) hours as needed for muscle spasms. 05/28/19   Long, Arlyss RepressJoshua G, MD    Family History Family History  Adopted: Yes    Social History Social History   Tobacco Use  . Smoking status: Current Some Day Smoker    Packs/day: 0.50    Years: 5.00    Pack years: 2.50    Types: Cigars  . Smokeless tobacco: Never Used  Substance Use Topics  . Alcohol use: Not Currently  . Drug use: No     Allergies   Patient has no known allergies.   Review of Systems Review of Systems  Constitutional: Negative for diaphoresis and fever.  HENT: Negative for sore throat.   Eyes: Negative for visual disturbance.  Respiratory: Negative for cough and shortness of breath.   Cardiovascular: Negative for palpitations and leg swelling.  Gastrointestinal: Negative for abdominal pain.  Genitourinary: Negative for dysuria.  Musculoskeletal: Negative for back pain.  Neurological: Negative for headaches.  Psychiatric/Behavioral: Negative for agitation, dysphoric mood, hallucinations and suicidal ideas. The patient is nervous/anxious. The patient is not hyperactive.   All other systems reviewed and are negative.  Physical Exam Updated Vital Signs BP (!) 146/100   Pulse (!) 56   Temp 97.8 F (36.6 C) (Oral)   Resp 16   Ht 5\' 5"  (1.651 m)   Wt 77.1 kg   SpO2 100%   BMI 28.29 kg/m   Physical Exam Vitals signs and nursing note reviewed.  Constitutional:      General: He is not in acute distress.    Appearance: He is normal weight.  HENT:     Head: Normocephalic and atraumatic.     Nose: Nose normal.  Eyes:     Conjunctiva/sclera: Conjunctivae normal.     Pupils: Pupils are equal, round, and reactive to light.  Neck:     Musculoskeletal: Normal range of motion and neck supple.  Cardiovascular:      Rate and Rhythm: Normal rate and regular rhythm.     Pulses: Normal pulses.     Heart sounds: Normal heart sounds.  Pulmonary:     Effort: Pulmonary effort is normal.     Breath sounds: Normal breath sounds.  Abdominal:     General: Abdomen is flat. Bowel sounds are normal.     Tenderness: There is no abdominal tenderness. There is no guarding.  Musculoskeletal: Normal range of motion.  Skin:    General: Skin is warm and dry.     Capillary Refill: Capillary refill takes less than 2 seconds.  Neurological:     General: No focal deficit present.     Mental Status: He is alert and oriented to person, place, and time.  Psychiatric:        Mood and Affect: Mood is anxious.      ED Treatments / Results  Labs (all labs ordered are listed, but only abnormal results are displayed) Labs Reviewed - No data to display  EKG  EKG Interpretation  Date/Time:  Wednesday June 10 2019 02:45:31 EDT Ventricular Rate:  55 PR Interval:    QRS Duration: 105 QT Interval:  411 QTC Calculation: 394 R Axis:   84 Text Interpretation:  Sinus rhythm Confirmed by Temple Ewart ([id number]) on 06/10/2019 2:46:58 AM       Radiology Dg Chest 2 View  Result Date: 06/08/2019 CLINICAL DATA:  Left-sided chest pain for several hours EXAM: CHEST - 2 VIEW COMPARISON:  05/28/2019, 05/30/2019 CT FINDINGS: The heart size and mediastinal contours are within normal limits. Both lungs are clear. The visualized skeletal structures are unremarkable. IMPRESSION: No active cardiopulmonary disease. Electronically Signed   By: Alcide CleverMark  Lukens M.D.   On: 06/08/2019 23:15    Procedures Procedures (including critical care time)  Medications Ordered in ED Medications  haloperidol (HALDOL) 5 MG tablet (has no administration in time range)     The patient is not SI or HI, no AH or VH.  He meets no criteria for hospitalization.  This is not cardiac.  The patient has anxiety.  He needs to follow up with a therapist and PMD and  start therapy and medication that is non addicting.  He is returning with frequency because he has gotten these medications in the past.  He does not like the hydroxyzine because he can't sleep with it.  He has been appropriately screened for life threatening illness.  I have instructed him to follow up to discuss SSRI therapy with a PMD/ therapist.    Final Clinical Impressions(s) / ED Diagnoses   Return for intractable cough, coughing up blood,fevers >100.4 unrelieved by medication, shortness of breath, intractable vomiting, chest pain,  shortness of breath, weakness,numbness, changes in speech, facial asymmetry,abdominal pain, passing out,Inability to tolerate liquids or food, cough, altered mental status or any concerns. No signs of systemic illness or infection. The patient is nontoxic-appearing on exam and vital signs are within normal limits.   I have reviewed the triage vital signs and the nursing notes. Pertinent labs &imaging results that were available during my care of the patient were reviewed by me and considered in my medical decision making (see chart for details).  After history, exam, and medical workup I feel the patient has been appropriately medically screened and is safe for discharge home. Pertinent diagnoses were discussed with the patient. Patient was given return precautions    Scotty Pinder, MD 06/10/19 2440

## 2019-06-10 NOTE — ED Triage Notes (Signed)
Pt here with c/o anxiety, chest pain and shob. Pt has been seen multiple times for same. Pt states hydroxyzine that was prescribed yesterday isn't helping.

## 2019-06-15 ENCOUNTER — Telehealth: Payer: Self-pay

## 2019-06-15 NOTE — Telephone Encounter (Signed)

## 2019-06-16 ENCOUNTER — Ambulatory Visit (INDEPENDENT_AMBULATORY_CARE_PROVIDER_SITE_OTHER): Payer: Self-pay | Admitting: Licensed Clinical Social Worker

## 2019-06-16 ENCOUNTER — Other Ambulatory Visit: Payer: Self-pay

## 2019-06-16 ENCOUNTER — Other Ambulatory Visit: Payer: Self-pay | Admitting: Internal Medicine

## 2019-06-16 ENCOUNTER — Ambulatory Visit (INDEPENDENT_AMBULATORY_CARE_PROVIDER_SITE_OTHER): Payer: Self-pay | Admitting: Internal Medicine

## 2019-06-16 ENCOUNTER — Encounter: Payer: Self-pay | Admitting: Internal Medicine

## 2019-06-16 VITALS — BP 149/94 | HR 87 | Temp 97.3°F | Resp 17 | Ht 65.0 in | Wt 170.4 lb

## 2019-06-16 DIAGNOSIS — F410 Panic disorder [episodic paroxysmal anxiety] without agoraphobia: Secondary | ICD-10-CM | POA: Insufficient documentation

## 2019-06-16 DIAGNOSIS — R030 Elevated blood-pressure reading, without diagnosis of hypertension: Secondary | ICD-10-CM

## 2019-06-16 DIAGNOSIS — F419 Anxiety disorder, unspecified: Secondary | ICD-10-CM

## 2019-06-16 DIAGNOSIS — F5105 Insomnia due to other mental disorder: Secondary | ICD-10-CM | POA: Insufficient documentation

## 2019-06-16 DIAGNOSIS — F418 Other specified anxiety disorders: Secondary | ICD-10-CM

## 2019-06-16 DIAGNOSIS — F4321 Adjustment disorder with depressed mood: Secondary | ICD-10-CM

## 2019-06-16 DIAGNOSIS — F99 Mental disorder, not otherwise specified: Secondary | ICD-10-CM | POA: Insufficient documentation

## 2019-06-16 MED ORDER — SERTRALINE HCL 50 MG PO TABS: ORAL_TABLET | ORAL | 3 refills | Status: DC

## 2019-06-16 MED ORDER — HYDROXYZINE HCL 25 MG PO TABS: 25.0000 mg | ORAL_TABLET | Freq: Four times a day (QID) | ORAL | 1 refills | Status: DC | PRN

## 2019-06-16 NOTE — Patient Instructions (Signed)
Start Zoloft as discussed today. Use the Hydroxyzine as needed.   Panic Attack  A panic attack is when you suddenly feel very afraid, uncomfortable, or nervous (anxious). A panic attack can happen when you are scared or for no reason. A panic attack can feel like a serious problem. It can even feel like a heart attack or stroke. See your doctor when you have a panic attack to make sure you do not have a serious problem. Follow these instructions at home:  Take medicines only as told by your doctor.  If you feel worried or nervous, try not to have caffeine.  Take good care of your health. To do this: ? Eat healthy. Make sure to eat fresh fruits and vegetables, whole grains, lean meats, and low-fat dairy. ? Get enough sleep. Try to sleep for 7-8 hours each night. ? Exercise. Try to be active for 30 minutes 5 or more days a week. ? Do not smoke. Talk to your doctor if you need help quitting. ? Limit how much alcohol you drink:  If you are a woman who is not pregnant: try not to have more than 1 drink a day.  If you are a man: try not to have more than 2 drinks a day.  One drink equals 12 oz of beer, 5 oz of wine, or 1 oz of hard liquor.  Keep all follow-up visits as told by your doctor. This is important. Contact a doctor if:  Your symptoms do not get better.  Your symptoms get worse.  You are not able to take your medicines as told. Get help right away if:  You have thoughts of hurting yourself or others.  You have symptoms of a panic attack. Do not drive yourself to the hospital. Have someone else drive you or call an ambulance. If you feel like you may hurt yourself or others, or have thoughts about taking your own life, get help right away. You can go to your nearest emergency department or call:  Your local emergency services (911 in the [place]  A suicide crisis helpline, such as the [place] at (662)014-4130. This is open 24 hours a day.  Summary  A panic attack is when you suddenly feel very afraid, uncomfortable, or nervous (anxious).  See your doctor when you have a panic attack to make sure that you do not have another serious problem.  If you feel like you may hurt yourself or others, get help right away by calling 911. This information is not intended to replace advice given to you by your health care provider. Make sure you discuss any questions you have with your health care provider. Document Released: 11/24/2010 Document Revised: 10/04/2017 Document Reviewed: 12/05/2016 Elsevier Patient Education  2020 [employer].
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
## 2019-06-17 LAB — TSH: TSH: 1.29 u[IU]/mL (ref 0.450–4.500)

## 2019-06-18 ENCOUNTER — Other Ambulatory Visit: Payer: Self-pay

## 2019-06-18 ENCOUNTER — Ambulatory Visit
Admission: EM | Admit: 2019-06-18 | Discharge: 2019-06-18 | Disposition: A | Discharge status: Home / Self Care | Payer: Self-pay | Attending: Physician Assistant | Admitting: Physician Assistant

## 2019-06-18 DIAGNOSIS — R5383 Other fatigue: Secondary | ICD-10-CM

## 2019-06-18 DIAGNOSIS — R51 Headache: Secondary | ICD-10-CM

## 2019-06-18 DIAGNOSIS — R519 Headache, unspecified: Secondary | ICD-10-CM

## 2019-06-18 DIAGNOSIS — Z20828 Contact with and (suspected) exposure to other viral communicable diseases: Secondary | ICD-10-CM

## 2019-06-18 MED ORDER — KETOROLAC TROMETHAMINE 30 MG/ML IJ SOLN
30.0000 mg | Freq: Once | INTRAMUSCULAR | Status: AC
  Administered 2019-06-18: 14:00:00 30 mg via INTRAMUSCULAR

## 2019-06-18 NOTE — ED Triage Notes (Signed)
Pt c/o headache and fatigue since yesterday

## 2019-06-18 NOTE — ED Provider Notes (Signed)
[facility]    CSN: [id number] Arrival date & time: 06/18/19  1254     History   Chief Complaint Chief Complaint  Patient presents with  . Headache    HPI Tobe Kervin is a 28 y.o. male.   28 year old male comes in for 2-day history of headache, fatigue.  Patient states headache is right-sided, aching/throbbing in sensation.  This is constant, but waxes and wanes in intensity.  He denies known aggravating or alleviating factor.  Denies photophobia, phonophobia, nausea, vomiting.  Denies URI symptoms such as cough, congestion, sore throat.  Denies fever, chills, body aches, night sweats.  Denies recent head injury/loss of consciousness.  He took Tylenol without relief.  He was seen at primary care 2 days ago, and was prescribed Zoloft for anxiety.  He has not started this medication.  He does endorse trouble sleeping due to anxiety.  No obvious sick contact.  No COVID contact.  No travels.     Past Medical History:  Diagnosis Date  . A-fib ([place])   . Anxiety   . Heart murmur   . History of palpitations     Patient Active Problem List   Diagnosis Date Noted  . Panic disorder 06/16/2019  . Elevated blood pressure reading 06/16/2019  . Insomnia due to other mental disorder 06/16/2019    Past Surgical History:  Procedure Laterality Date  . ADENOIDECTOMY    . TONSILLECTOMY         Home Medications    Prior to Admission medications   Medication Sig Start Date End Date Taking? Authorizing Provider  hydrOXYzine (ATARAX/VISTARIL) 25 MG tablet Take 1 tablet (25 mg total) by mouth every 6 (six) hours as needed. 06/16/19   Ladell Pier, MD  ibuprofen (ADVIL) 800 MG tablet Take 1 tablet (800 mg total) by mouth every 8 (eight) hours as needed for moderate pain. 05/28/19   Long, Wonda Olds, MD  methocarbamol (ROBAXIN) 500 MG tablet Take 1 tablet (500 mg total) by mouth every 8 (eight) hours as needed for muscle spasms. 05/28/19   Long, Wonda Olds, MD  sertraline  (ZOLOFT) 50 MG tablet 1/2 tab PO daily x 2 wks then 1 tab PO daily. 06/16/19   Ladell Pier, MD    Family History Family History  Adopted: Yes    Social History Social History   Tobacco Use  . Smoking status: Current Some Day Smoker    Packs/day: 2.00    Years: 5.00    Pack years: 10.00    Types: Cigars  . Smokeless tobacco: Never Used  Substance Use Topics  . Alcohol use: Not Currently  . Drug use: No     Allergies   Patient has no known allergies.   Review of Systems Review of Systems  Reason unable to perform ROS: See HPI as above.     Physical Exam Triage Vital Signs ED Triage Vitals  Enc Vitals Group     BP 06/18/19 1306 (!) 141/89     Pulse Rate 06/18/19 1306 73     Resp 06/18/19 1306 16     Temp 06/18/19 1306 98 F (36.7 C)     Temp Source 06/18/19 1306 Oral     SpO2 06/18/19 1306 98 %     Weight --      Height --      Head Circumference --      Peak Flow --      Pain Score 06/18/19 1307 6  Pain Loc --      Pain Edu? --      Excl. in GC? --    No data found.  Updated Vital Signs BP (!) 141/89 (BP Location: Left Arm)   Pulse 73   Temp 98 F (36.7 C) (Oral)   Resp 16   SpO2 98%   Physical Exam Constitutional:      General: He is not in acute distress.    Appearance: Normal appearance. He is not ill-appearing, toxic-appearing or diaphoretic.  HENT:     Head: Normocephalic and atraumatic.     Mouth/Throat:     Mouth: Mucous membranes are moist.     Pharynx: Oropharynx is clear. Uvula midline.  Neck:     Musculoskeletal: Normal range of motion and neck supple.  Cardiovascular:     Rate and Rhythm: Normal rate and regular rhythm.     Heart sounds: Normal heart sounds. No murmur. No friction rub. No gallop.   Pulmonary:     Effort: Pulmonary effort is normal. No accessory muscle usage, prolonged expiration, respiratory distress or retractions.     Comments: Lungs clear to auscultation without adventitious lung sounds.  Neurological:     General: No focal deficit present.     Mental Status: He is alert and oriented to person, place, and time.     GCS: GCS eye subscore is 4. GCS verbal subscore is 5. GCS motor subscore is 6.     Cranial Nerves: Cranial nerves are intact.     Sensory: Sensation is intact.     Motor: Motor function is intact.     Coordination: Coordination is intact.     Gait: Gait is intact.    UC Treatments / Results  Labs (all labs ordered are listed, but only abnormal results are displayed) Labs Reviewed  NOVEL CORONAVIRUS, NAA    EKG   Radiology No results found.  Procedures Procedures (including critical care time)  Medications Ordered in UC Medications  ketorolac (TORADOL) 30 MG/ML injection 30 mg (30 mg Intramuscular Given 06/18/19 1348)    Initial Impression / Assessment and Plan / UC Course  I have reviewed the triage vital signs and the nursing notes.  Pertinent labs & imaging results that were available during my care of the patient were reviewed by me and considered in my medical decision making (see chart for details).    No alarming signs on exam. Will test for COVID, toradol injection in office today for headaches. Discussed symptoms could also be due to lack of sleep, discussed using melatonin and discussed sleep hygiene. Discussed to start zoloft for anxiety as per PCP. Return precautions given. Patient expresses understanding and agrees to plan.  Patient called mother and also discussed plan with mother. Questions answered and mother also expresses understanding and agrees to plan.  Final Clinical Impressions(s) / UC Diagnoses   Final diagnoses:  Acute intractable headache, unspecified headache type  Fatigue, unspecified type    ED Prescriptions    None        Belinda FisherYu, Cydnee Fuquay V, PA-C 06/18/19 1405

## 2019-06-18 NOTE — Discharge Instructions (Signed)
As discussed, COVID testing ordered.  Please state quarantine until results return.  Toradol injection in office today for the headache.  Decrease in sleep can cause symptoms as well, you can try over-the-counter melatonin at nighttime to help with sleep. Keep hydrated, urine should be clear to pale yellow in color.  Start Zoloft as per PCP for anxiety.  Monitor for worsening symptoms, shortness of breath, go to the emergency department for further evaluation.

## 2019-06-19 LAB — NOVEL CORONAVIRUS, NAA: SARS-CoV-2, NAA: NOT DETECTED
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
## 2019-06-22 ENCOUNTER — Encounter (HOSPITAL_COMMUNITY): Payer: Self-pay

## 2019-06-24 ENCOUNTER — Encounter: Payer: Self-pay | Admitting: Nurse Practitioner

## 2019-06-24 NOTE — Progress Notes (Signed)
I have not heard back from the patient. Will close chart

## 2019-06-24 NOTE — Progress Notes (Signed)
Called and had to LVM for tele visit. Patient not available.  Gildardo Pounds, FNP-BC

## 2019-06-24 NOTE — Progress Notes (Signed)
Has c/o upper-mid back pain. Constant aggravating pain that worsens with movement. Rates pain as 6-7/10

## 2019-06-25 ENCOUNTER — Other Ambulatory Visit: Payer: Self-pay

## 2019-06-25 ENCOUNTER — Encounter: Payer: Self-pay | Admitting: Emergency Medicine

## 2019-06-25 ENCOUNTER — Ambulatory Visit
Admission: EM | Admit: 2019-06-25 | Discharge: 2019-06-25 | Disposition: A | Discharge status: Home / Self Care | Payer: Self-pay

## 2019-06-25 DIAGNOSIS — F411 Generalized anxiety disorder: Secondary | ICD-10-CM
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
## 2019-06-25 NOTE — ED Notes (Signed)
Patient able to ambulate independently  

## 2019-06-25 NOTE — Discharge Instructions (Signed)
Recommend he go to ER for further evaluation/armrests due to: Management. Call your LCSW Christa See) tomorrow morning to schedule appointment as you may need adjustment medications: [phone]

## 2019-06-30 ENCOUNTER — Ambulatory Visit (INDEPENDENT_AMBULATORY_CARE_PROVIDER_SITE_OTHER): Payer: Self-pay | Admitting: Internal Medicine

## 2019-06-30 ENCOUNTER — Encounter: Payer: Self-pay | Admitting: Internal Medicine

## 2019-06-30 ENCOUNTER — Other Ambulatory Visit: Payer: Self-pay

## 2019-06-30 DIAGNOSIS — F410 Panic disorder [episodic paroxysmal anxiety] without agoraphobia: Secondary | ICD-10-CM

## 2019-06-30 DIAGNOSIS — B353 Tinea pedis: Secondary | ICD-10-CM

## 2019-06-30 DIAGNOSIS — R6889 Other general symptoms and signs: Secondary | ICD-10-CM

## 2019-06-30 DIAGNOSIS — R0989 Other specified symptoms and signs involving the circulatory and respiratory systems: Secondary | ICD-10-CM

## 2019-06-30 MED ORDER — HYDROXYZINE HCL 50 MG PO TABS: 50.0000 mg | ORAL_TABLET | Freq: Four times a day (QID) | ORAL | 1 refills | Status: DC | PRN
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
## 2019-06-30 NOTE — Progress Notes (Signed)
Patient states that he is only taking the Hydroxyzine. States that his anxiety was improving at first but now it's starting to worsen again.

## 2019-07-04 ENCOUNTER — Other Ambulatory Visit: Payer: Self-pay

## 2019-07-04 ENCOUNTER — Encounter (HOSPITAL_BASED_OUTPATIENT_CLINIC_OR_DEPARTMENT_OTHER): Payer: Self-pay | Admitting: Emergency Medicine

## 2019-07-04 ENCOUNTER — Emergency Department (HOSPITAL_BASED_OUTPATIENT_CLINIC_OR_DEPARTMENT_OTHER)
Admission: EM | Admit: 2019-07-04 | Discharge: 2019-07-04 | Disposition: A | Discharge status: Home / Self Care | Payer: Self-pay | Attending: Emergency Medicine | Admitting: Emergency Medicine

## 2019-07-04 DIAGNOSIS — Z79899 Other long term (current) drug therapy: Secondary | ICD-10-CM | POA: Insufficient documentation

## 2019-07-04 DIAGNOSIS — I10 Essential (primary) hypertension: Secondary | ICD-10-CM | POA: Insufficient documentation

## 2019-07-04 NOTE — ED Notes (Signed)
[facility]io[facility] called a second time to be roomed, no answer.

## 2019-07-04 NOTE — ED Triage Notes (Signed)
Pt reports high BP readings for the past several days. He was told it was high at his last appt and told to start checking it daily. He is not on medication for HTN.

## 2019-07-05 ENCOUNTER — Encounter: Payer: Self-pay | Admitting: Emergency Medicine

## 2019-07-05 ENCOUNTER — Other Ambulatory Visit: Payer: Self-pay

## 2019-07-05 ENCOUNTER — Ambulatory Visit
Admission: EM | Admit: 2019-07-05 | Discharge: 2019-07-05 | Disposition: A | Discharge status: Home / Self Care | Payer: Self-pay | Attending: Emergency Medicine | Admitting: Emergency Medicine

## 2019-07-05 DIAGNOSIS — I10 Essential (primary) hypertension: Secondary | ICD-10-CM

## 2019-07-05 MED ORDER — AMLODIPINE BESYLATE 10 MG PO TABS: 10.0000 mg | ORAL_TABLET | Freq: Every day | ORAL | 0 refills | Status: DC

## 2019-07-05 NOTE — ED Triage Notes (Signed)
Per pt he has been having high blood pressure and having some sob with deep breathes but also said he has anxiety and says that plays a factor in him having the sob.  Pt is seeing his primary and has been taking pressures every day. Pt says his dr Geraldine Solar to see him again after keeping log of pressure.

## 2019-07-05 NOTE — Discharge Instructions (Addendum)
Take blood pressure medication as discussed. Check blood pressure 2-3 times a week as discussed, bring log to PCP appointment. Go to ER for further evaluation if you develop severe chest pain, shortness of breath, nausea/vomiting, abdominal pain

## 2019-07-05 NOTE — ED Provider Notes (Signed)
[facility]    CSN: [id number] Arrival date & time: 07/05/19  1211      History   Chief Complaint Chief Complaint  Patient presents with   Hypertension    HPI Mario Luna is a 28 y.o. male presenting for elevated blood pressure readings at home.  Patient understands he has a PCP appointment pending for 9/15, intends to keep this appointment.  This provider saw patient on 06/25/2019 for anxiety.  Since then, patient has followed with his LCSW, resume sertraline and is tolerating this well.  Patient wondering if he can start a medication due to his high blood pressures before seeing PCP.     Past Medical History:  Diagnosis Date   A-fib [facility](HCC)    Anxiety    Heart murmur    History of palpitations     Patient Active Problem List   Diagnosis Date Noted   Panic disorder 06/16/2019   Elevated blood pressure reading 06/16/2019   Insomnia due to other mental disorder 06/16/2019    Past Surgical History:  Procedure Laterality Date   ADENOIDECTOMY     TONSILLECTOMY         Home Medications    Prior to Admission medications   Medication Sig Start Date End Date Taking? Authorizing Provider  amLODipine (NORVASC) 10 MG tablet Take 1 tablet (10 mg total) by mouth daily. 07/05/19   Hall-Potvin, [place], PA-C  hydrOXYzine (ATARAX/VISTARIL) 50 MG tablet Take 1 tablet (50 mg total) by mouth every 6 (six) hours as needed. 06/30/19   Marcine MatarJohnson, Deborah B, MD  ibuprofen (ADVIL) 800 MG tablet Take 1 tablet (800 mg total) by mouth every 8 (eight) hours as needed for moderate pain. 05/28/19   Long, Arlyss RepressJoshua G, MD  sertraline (ZOLOFT) 50 MG tablet 1/2 tab PO daily x 2 wks then 1 tab PO daily. Patient not taking: Reported on 06/30/2019 06/16/19   Marcine MatarJohnson, Deborah B, MD    Family History Family History  Adopted: Yes    Social History Social History   Tobacco Use   Smoking status: Current Some Day Smoker    Packs/day: 2.00    Years: 5.00    Pack years: 10.00   Types: Cigars   Smokeless tobacco: Never Used  Substance Use Topics   Alcohol use: Not Currently   Drug use: No     Allergies   Patient has no known allergies.   Review of Systems Review of Systems  Constitutional: Negative for fatigue and fever.  Respiratory: Negative for cough and shortness of breath.   Cardiovascular: Negative for chest pain and palpitations.  Gastrointestinal: Negative for abdominal pain, diarrhea and vomiting.  Musculoskeletal: Negative for arthralgias and myalgias.  Skin: Negative for rash and wound.  Neurological: Negative for speech difficulty and headaches.  All other systems reviewed and are negative.    Physical Exam Triage Vital Signs ED Triage Vitals  Enc Vitals Group     BP 07/05/19 1221 (!) 157/107     Pulse Rate 07/05/19 1221 96     Resp 07/05/19 1221 18     Temp 07/05/19 1221 98 F (36.7 C)     Temp Source 07/05/19 1221 Oral     SpO2 07/05/19 1221 98 %     Weight --      Height --      Head Circumference --      Peak Flow --      Pain Score 07/05/19 1222 0     Pain Loc --  Pain Edu? --      Excl. in [place]? --    No data found.  Updated Vital Signs BP (!) 157/107 (BP Location: Right Arm)    Pulse 96    Temp 98 F (36.7 C) (Oral)    Resp 18    SpO2 98%   Visual Acuity Right Eye Distance:   Left Eye Distance:   Bilateral Distance:    Right Eye Near:   Left Eye Near:    Bilateral Near:     Physical Exam Constitutional:      General: He is not in acute distress. HENT:     Head: Normocephalic and atraumatic.     Mouth/Throat:     Mouth: Mucous membranes are moist.     Pharynx: Oropharynx is clear. No oropharyngeal exudate or posterior oropharyngeal erythema.  Eyes:     General: No scleral icterus.    Pupils: Pupils are equal, round, and reactive to light.  Cardiovascular:     Rate and Rhythm: Normal rate and regular rhythm.     Heart sounds: No murmur. No gallop.   Pulmonary:     Effort: Pulmonary effort is  normal. No respiratory distress.     Breath sounds: No wheezing or rales.  Skin:    Coloration: Skin is not jaundiced or pale.  Neurological:     Mental Status: He is alert and oriented to person, place, and time.      UC Treatments / Results  Labs (all labs ordered are listed, but only abnormal results are displayed) Labs Reviewed - No data to display  EKG   Radiology No results found.  Procedures Procedures (including critical care time)  Medications Ordered in UC Medications - No data to display  Initial Impression / Assessment and Plan / UC Course  I have reviewed the triage vital signs and the nursing notes.  Pertinent labs & imaging results that were available during my care of the patient were reviewed by me and considered in my medical decision making (see chart for details).     1.  Essential hypertension Patient's blood pressure is elevated today: Denies cardiopulmonary symptoms, appears well/no acute distress - EKG deferred.  Patient has follow-up pending on 9/15, that this provider is agreeable amlodipine due to patient's compliance with previously recommended psych follow-up.  Return precautions discussed, patient verbalized understanding and is agreeable to plan. Final Clinical Impressions(s) / UC Diagnoses   Final diagnoses:  Essential hypertension     Discharge Instructions     Take blood pressure medication as discussed. Check blood pressure 2-3 times a week as discussed, bring log to PCP appointment. Go to ER for further evaluation if you develop severe chest pain, shortness of breath, nausea/vomiting, abdominal pain    ED Prescriptions    Medication Sig Dispense Auth. Provider   amLODipine (NORVASC) 10 MG tablet Take 1 tablet (10 mg total) by mouth daily. 30 tablet Hall-Potvin, [place], PA-C     Controlled Substance Prescriptions [place] Controlled Substance Registry consulted? Not Applicable   Quincy Sheehan, [place] 07/05/19 1247

## 2019-07-06 ENCOUNTER — Telehealth: Payer: Self-pay | Admitting: Internal Medicine

## 2019-07-06 NOTE — Telephone Encounter (Signed)
Paperwork received on 07/06/2019  Type of paperwork: [place] FMLA paperwork  Route received: Patient left at office   Has patient completed their portion of the paperwork: yes   Has office staff updated demographics of paperwork: yes   Paperwork routed to provider : will give to provider when she's at the office tomorrow.

## 2019-07-06 NOTE — Telephone Encounter (Signed)
Pt came to drop off FMLA paperwork, he is aware the paperwork will be completed in 7-10 business days

## 2019-07-09 NOTE — Telephone Encounter (Signed)
Per Dr. Wynetta Emery, this will need to be discussed at his visit on 07/21/2019. She is unable to complete this at this time.

## 2019-07-20 ENCOUNTER — Telehealth: Payer: Self-pay

## 2019-07-20 NOTE — Telephone Encounter (Signed)
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
## 2019-07-21 ENCOUNTER — Ambulatory Visit (INDEPENDENT_AMBULATORY_CARE_PROVIDER_SITE_OTHER): Payer: Self-pay | Admitting: Critical Care Medicine

## 2019-07-21 ENCOUNTER — Other Ambulatory Visit: Payer: Self-pay

## 2019-07-21 VITALS — BP 133/90 | HR 97 | Temp 97.3°F | Resp 17 | Wt 181.0 lb

## 2019-07-21 DIAGNOSIS — F17210 Nicotine dependence, cigarettes, uncomplicated: Secondary | ICD-10-CM

## 2019-07-21 DIAGNOSIS — B353 Tinea pedis: Secondary | ICD-10-CM | POA: Insufficient documentation

## 2019-07-21 DIAGNOSIS — Z23 Encounter for immunization: Secondary | ICD-10-CM

## 2019-07-21 DIAGNOSIS — F410 Panic disorder [episodic paroxysmal anxiety] without agoraphobia: Secondary | ICD-10-CM

## 2019-07-21 DIAGNOSIS — F17200 Nicotine dependence, unspecified, uncomplicated: Secondary | ICD-10-CM | POA: Insufficient documentation

## 2019-07-21 DIAGNOSIS — I10 Essential (primary) hypertension: Secondary | ICD-10-CM | POA: Insufficient documentation

## 2019-07-21 DIAGNOSIS — B351 Tinea unguium: Secondary | ICD-10-CM

## 2019-07-21 MED ORDER — SERTRALINE HCL 50 MG PO TABS: 50.0000 mg | ORAL_TABLET | Freq: Every day | ORAL | 3 refills | Status: DC

## 2019-07-21 MED ORDER — AMLODIPINE BESYLATE 10 MG PO TABS: 10.0000 mg | ORAL_TABLET | Freq: Every day | ORAL | 3 refills | Status: DC

## 2019-07-21 NOTE — Assessment & Plan Note (Signed)
Onychomycosis of both toenails both great toes and other toes as well along with tinea pedis  We will make a referral to podiatry for further follow-up
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
## 2019-07-21 NOTE — Patient Instructions (Addendum)
I sent refills on sertraline 50 mg take a whole tablet daily  I sent refills on amlodipine take 10 mg daily  Use Atarax hydroxyzine only as needed  Referral to podiatry will be made for your toenail fungus  Flu vaccine was given today  Return in follow-up 4 months  Follow the blood pressure diet as outlined below  Hypertension, Adult High blood pressure (hypertension) is when the force of blood pumping through the arteries is too strong. The arteries are the blood vessels that carry blood from the heart throughout the body. Hypertension forces the heart to work harder to pump blood and may cause arteries to become narrow or stiff. Untreated or uncontrolled hypertension can cause a heart attack, heart failure, a stroke, kidney disease, and other problems. A blood pressure reading consists of a higher number over a lower number. Ideally, your blood pressure should be below 120/80. The first ("top") number is called the systolic pressure. It is a measure of the pressure in your arteries as your heart beats. The second ("bottom") number is called the diastolic pressure. It is a measure of the pressure in your arteries as the heart relaxes. What are the causes? The exact cause of this condition is not known. There are some conditions that result in or are related to high blood pressure. What increases the risk? Some risk factors for high blood pressure are under your control. The following factors may make you more likely to develop this condition:  Smoking.  Having type 2 diabetes mellitus, high cholesterol, or both.  Not getting enough exercise or physical activity.  Being overweight.  Having too much fat, sugar, calories, or salt (sodium) in your diet.  Drinking too much alcohol. Some risk factors for high blood pressure may be difficult or impossible to change. Some of these factors include:  Having chronic kidney disease.  Having a family history of high blood  pressure.  Age. Risk increases with age.  Race. You may be at higher risk if you are African American.  Gender. Men are at higher risk than women before age 26. After age 26, women are at higher risk than men.  Having obstructive sleep apnea.  Stress. What are the signs or symptoms? High blood pressure may not cause symptoms. Very high blood pressure (hypertensive crisis) may cause:  Headache.  Anxiety.  Shortness of breath.  Nosebleed.  Nausea and vomiting.  Vision changes.  Severe chest pain.  Seizures. How is this diagnosed? This condition is diagnosed by measuring your blood pressure while you are seated, with your arm resting on a flat surface, your legs uncrossed, and your feet flat on the floor. The cuff of the blood pressure monitor will be placed directly against the skin of your upper arm at the level of your heart. It should be measured at least twice using the same arm. Certain conditions can cause a difference in blood pressure between your right and left arms. Certain factors can cause blood pressure readings to be lower or higher than normal for a short period of time:  When your blood pressure is higher when you are in a health care provider's office than when you are at home, this is called white coat hypertension. Most people with this condition do not need medicines.  When your blood pressure is higher at home than when you are in a health care provider's office, this is called masked hypertension. Most people with this condition may need medicines to control  blood pressure. If you have a high blood pressure reading during one visit or you have normal blood pressure with other risk factors, you may be asked to:  Return on a different day to have your blood pressure checked again.  Monitor your blood pressure at home for 1 week or longer. If you are diagnosed with hypertension, you may have other blood or imaging tests to help your health care provider  understand your overall risk for other conditions. How is this treated? This condition is treated by making healthy lifestyle changes, such as eating healthy foods, exercising more, and reducing your alcohol intake. Your health care provider may prescribe medicine if lifestyle changes are not enough to get your blood pressure under control, and if:  Your systolic blood pressure is above 130.  Your diastolic blood pressure is above 80. Your personal target blood pressure may vary depending on your medical conditions, your age, and other factors. Follow these instructions at home: Eating and drinking   Eat a diet that is high in fiber and potassium, and low in sodium, added sugar, and fat. An example eating plan is called the DASH (Dietary Approaches to Stop Hypertension) diet. To eat this way: ? Eat plenty of fresh fruits and vegetables. Try to fill one half of your plate at each meal with fruits and vegetables. ? Eat whole grains, such as whole-wheat pasta, brown rice, or whole-grain bread. Fill about one fourth of your plate with whole grains. ? Eat or drink low-fat dairy products, such as skim milk or low-fat yogurt. ? Avoid fatty cuts of meat, processed or cured meats, and poultry with skin. Fill about one fourth of your plate with lean proteins, such as fish, chicken without skin, beans, eggs, or tofu. ? Avoid pre-made and processed foods. These tend to be higher in sodium, added sugar, and fat.  Reduce your daily sodium intake. Most people with hypertension should eat less than 1,500 mg of sodium a day.  Do not drink alcohol if: ? Your health care provider tells you not to drink. ? You are pregnant, may be pregnant, or are planning to become pregnant.  If you drink alcohol: ? Limit how much you use to:  0-1 drink a day for women.  0-2 drinks a day for men. ? Be aware of how much alcohol is in your drink. In the [place] one drink equals one 12 oz bottle of beer (355 mL), one 5 oz  glass of wine (148 mL), or one 1 oz glass of hard liquor (44 mL). Lifestyle   Work with your health care provider to maintain a healthy body weight or to lose weight. Ask what an ideal weight is for you.  Get at least 30 minutes of exercise most days of the week. Activities may include walking, swimming, or biking.  Include exercise to strengthen your muscles (resistance exercise), such as Pilates or lifting weights, as part of your weekly exercise routine. Try to do these types of exercises for 30 minutes at least 3 days a week.  Do not use any products that contain nicotine or tobacco, such as cigarettes, e-cigarettes, and chewing tobacco. If you need help quitting, ask your health care provider.  Monitor your blood pressure at home as told by your health care provider.  Keep all follow-up visits as told by your health care provider. This is important. Medicines  Take over-the-counter and prescription medicines only as told by your health care provider. Follow directions carefully. Blood pressure medicines  must be taken as prescribed.  Do not skip doses of blood pressure medicine. Doing this puts you at risk for problems and can make the medicine less effective.  Ask your health care provider about side effects or reactions to medicines that you should watch for. Contact a health care provider if you:  Think you are having a reaction to a medicine you are taking.  Have headaches that keep coming back (recurring).  Feel dizzy.  Have swelling in your ankles.  Have trouble with your vision. Get help right away if you:  Develop a severe headache or confusion.  Have unusual weakness or numbness.  Feel faint.  Have severe pain in your chest or abdomen.  Vomit repeatedly.  Have trouble breathing. Summary  Hypertension is when the force of blood pumping through your arteries is too strong. If this condition is not controlled, it may put you at risk for serious  complications.  Your personal target blood pressure may vary depending on your medical conditions, your age, and other factors. For most people, a normal blood pressure is less than 120/80.  Hypertension is treated with lifestyle changes, medicines, or a combination of both. Lifestyle changes include losing weight, eating a healthy, low-sodium diet, exercising more, and limiting alcohol. This information is not intended to replace advice given to you by your health care provider. Make sure you discuss any questions you have with your health care provider. Document Released: 10/22/2005 Document Revised: 07/02/2018 Document Reviewed: 07/02/2018 Elsevier Patient Education  2020 [employer]   Smoking Tobacco Information, Adult Smoking tobacco can be harmful to your health. Tobacco contains a poisonous (toxic), colorless chemical called nicotine. Nicotine is addictive. It changes the brain and can make it hard to stop smoking. Tobacco also has other toxic chemicals that can hurt your body and raise your risk of many cancers. How can smoking tobacco affect me? Smoking tobacco puts you at risk for:  Cancer. Smoking is most commonly associated with lung cancer, but can also lead to cancer in other parts of the body.  Chronic obstructive pulmonary disease (COPD). This is a long-term lung condition that makes it hard to breathe. It also gets worse over time.  High blood pressure (hypertension), heart disease, stroke, or heart attack.  Lung infections, such as pneumonia.  Cataracts. This is when the lenses in the eyes become clouded.  Digestive problems. This may include peptic ulcers, heartburn, and gastroesophageal reflux disease (GERD).  Oral health problems, such as gum disease and tooth loss.  Loss of taste and smell. Smoking can affect your appearance by causing:  Wrinkles.  Yellow or stained teeth, fingers, and fingernails. Smoking tobacco can also affect your social life,  because:  It may be challenging to find places to smoke when away from home. Many workplaces, [employer], hotels, and public places are tobacco-free.  Smoking is expensive. This is due to the cost of tobacco and the long-term costs of treating health problems from smoking.  Secondhand smoke may affect those around you. Secondhand smoke can cause lung cancer, breathing problems, and heart disease. Children of smokers have a higher risk for: ? Sudden infant death syndrome (SIDS). ? Ear infections. ? Lung infections. If you currently smoke tobacco, quitting now can help you:  Lead a longer and healthier life.  Look, smell, breathe, and feel better over time.  Save money.  Protect others from the harms of secondhand smoke. What actions can I take to prevent health problems? Quit smoking   Do  not start smoking. Quit if you already do.  Make a plan to quit smoking and commit to it. Look for programs to help you and ask your health care provider for recommendations and ideas.  Set a date and write down all the reasons you want to quit.  Let your friends and family know you are quitting so they can help and support you. Consider finding friends who also want to quit. It can be easier to quit with someone else, so that you can support each other.  Talk with your health care provider about using nicotine replacement medicines to help you quit, such as gum, lozenges, patches, sprays, or pills.  Do not replace cigarette smoking with electronic cigarettes, which are commonly called e-cigarettes. The safety of e-cigarettes is not known, and some may contain harmful chemicals.  If you try to quit but return to smoking, stay positive. It is common to slip up when you first quit, so take it one day at a time.  Be prepared for cravings. When you feel the urge to smoke, chew gum or suck on hard candy. Lifestyle  Stay busy and take care of your body.  Drink enough fluid to keep your urine pale  yellow.  Get plenty of exercise and eat a healthy diet. This can help prevent weight gain after quitting.  Monitor your eating habits. Quitting smoking can cause you to have a larger appetite than when you smoke.  Find ways to relax. Go out with friends or family to a movie or a restaurant where people do not smoke.  Ask your health care provider about having regular tests (screenings) to check for cancer. This may include blood tests, imaging tests, and other tests.  Find ways to manage your stress, such as meditation, yoga, or exercise. Where to find support To get support to quit smoking, consider:  Asking your health care provider for more information and resources.  Taking classes to learn more about quitting smoking.  Looking for local organizations that offer resources about quitting smoking.  Joining a support group for people who want to quit smoking in your local community.  Calling the smokefree.gov counselor helpline: 1-800-Quit-Now 206-327-2435) Where to find more information You may find more information about quitting smoking from:  HelpGuide.org: www.helpguide.org  https://hall.com/: smokefree.gov  American Lung Association: www.lung.org Contact a health care provider if you:  Have problems breathing.  Notice that your lips, nose, or fingers turn blue.  Have chest pain.  Are coughing up blood.  Feel faint or you pass out.  Have other health changes that cause you to worry. Summary  Smoking tobacco can negatively affect your health, the health of those around you, your finances, and your social life.  Do not start smoking. Quit if you already do. If you need help quitting, ask your health care provider.  Think about joining a support group for people who want to quit smoking in your local community. There are many effective programs that will help you to quit this behavior. This information is not intended to replace advice given to you by your health  care provider. Make sure you discuss any questions you have with your health care provider. Document Released: 11/06/2016 Document Revised: 12/11/2017 Document Reviewed: 11/06/2016 Elsevier Patient Education  2020 [employer].

## 2019-07-21 NOTE — Progress Notes (Signed)
Has noticed improvement in anxiety. Is taking medications as prescribed.  Has been checking BP regularly. States that his readings are getting better since being on medication.

## 2019-07-21 NOTE — Assessment & Plan Note (Signed)
Essential hypertension with improvement on amlodipine 10 mg daily  We will continue current dose for now

## 2019-08-24 ENCOUNTER — Telehealth: Payer: Self-pay

## 2019-08-24 NOTE — Telephone Encounter (Signed)

## 2019-08-25 ENCOUNTER — Other Ambulatory Visit: Payer: Self-pay

## 2019-08-25 ENCOUNTER — Ambulatory Visit (INDEPENDENT_AMBULATORY_CARE_PROVIDER_SITE_OTHER): Payer: Self-pay | Admitting: Internal Medicine

## 2019-08-25 VITALS — BP 136/87 | HR 86 | Temp 97.3°F | Resp 17 | Wt 194.6 lb

## 2019-08-25 DIAGNOSIS — F410 Panic disorder [episodic paroxysmal anxiety] without agoraphobia: Secondary | ICD-10-CM

## 2019-08-25 DIAGNOSIS — L2389 Allergic contact dermatitis due to other agents: Secondary | ICD-10-CM

## 2019-08-25 DIAGNOSIS — L02412 Cutaneous abscess of left axilla: Secondary | ICD-10-CM

## 2019-08-25 DIAGNOSIS — L02411 Cutaneous abscess of right axilla: Secondary | ICD-10-CM

## 2019-08-25 DIAGNOSIS — I10 Essential (primary) hypertension: Secondary | ICD-10-CM

## 2019-08-25 MED ORDER — SULFAMETHOXAZOLE-TRIMETHOPRIM 400-80 MG PO TABS: 1.0000 | ORAL_TABLET | Freq: Two times a day (BID) | ORAL | 0 refills | Status: DC

## 2019-08-25 MED ORDER — TRIAMCINOLONE ACETONIDE 0.1 % EX CREA: TOPICAL_CREAM | CUTANEOUS | 0 refills | Status: DC

## 2019-08-25 NOTE — Progress Notes (Signed)
Patient here with c/o rash under his armpits x 1 week. Hasn't changed deodorant, soap or detergent. States that area is itchy. Has been using OTC Benadryl itch cream with some relief.

## 2019-08-25 NOTE — Patient Instructions (Signed)
Please discontinue the new soap.  Go back to using [employer].  Use the triamcinolone cream twice a day to the affected areas.  Discontinue using the Benadryl cream.  I have prescribed an antibiotic called Bactrim that you will take twice a day for 7 days.  Please consider getting himself a medication box to help remind yourself to take your medications including blood pressure medicine daily.

## 2019-08-25 NOTE — Progress Notes (Signed)
Patient ID: Mario Luna, male    DOB: 09/04/1991  MRN: [id number]  CC: Rash   Subjective: Mario Luna is a 28 y.o. male who presents for UC visit His concerns today include:  Patient with history of panic disorder, HTN, tobacco dependence  Patient was last seen 07/21/2019 by Dr. Joya Gaskins.  Presents today c/o itchy rash in axilla area BL x 1 wk. Also has a bump at the back of neck and RT buttock.  No change in deodorant or detergent.  Uses Dove soap but recently has been using a new one Using Benadryl anti-itch cream which helps.   HTN: was taking Norvasc regularly but "it slipped my mind the past few days."  Did not take as yet for today Limits salt in foods. Less fried and greasy foods  Panic Disorder: doing well on the Zoloft and Hydroxyzine.  Taking the latter just once a day now.   Patient Active Problem List   Diagnosis Date Noted  . Essential hypertension 07/21/2019  . Onychomycosis of toenail 07/21/2019  . Tinea pedis of both feet 07/21/2019  . Tobacco use disorder 07/21/2019  . Panic disorder 06/16/2019  . Insomnia due to other mental disorder 06/16/2019     Current Outpatient Medications on File Prior to Visit  Medication Sig Dispense Refill  . amLODipine (NORVASC) 10 MG tablet Take 1 tablet (10 mg total) by mouth daily. 30 tablet 3  . hydrOXYzine (ATARAX/VISTARIL) 50 MG tablet Take 1 tablet (50 mg total) by mouth every 6 (six) hours as needed. 120 tablet 1  . sertraline (ZOLOFT) 50 MG tablet Take 1 tablet (50 mg total) by mouth daily. 1/2 tab PO daily x 2 wks then 1 tab PO daily. 30 tablet 3   No current facility-administered medications on file prior to visit.     No Known Allergies  Social History   Socioeconomic History  . Marital status: Single    Spouse name: Not on file  . Number of children: Not on file  . Years of education: Not on file  . Highest education level: Not on file  Occupational History    Comment: [employer]   Social Needs  . Financial resource strain: Not on file  . Food insecurity    Worry: Not on file    Inability: Not on file  . Transportation needs    Medical: Not on file    Non-medical: Not on file  Tobacco Use  . Smoking status: Current Some Day Smoker    Packs/day: 2.00    Years: 5.00    Pack years: 10.00    Types: Cigars  . Smokeless tobacco: Never Used  Substance and Sexual Activity  . Alcohol use: Not Currently  . Drug use: No  . Sexual activity: Not on file  Lifestyle  . Physical activity    Days per week: Not on file    Minutes per session: Not on file  . Stress: Not on file  Relationships  . Social Herbalist on phone: Not on file    Gets together: Not on file    Attends religious service: Not on file    Active member of club or organization: Not on file    Attends meetings of clubs or organizations: Not on file    Relationship status: Not on file  . Intimate partner violence    Fear of current or ex partner: Not on file    Emotionally abused: Not on  file    Physically abused: Not on file    Forced sexual activity: Not on file  Other Topics Concern  . Not on file  Social History Narrative  . Not on file    Family History  Adopted: Yes    Past Surgical History:  Procedure Laterality Date  . ADENOIDECTOMY    . TONSILLECTOMY      ROS: Review of Systems Negative except as stated above  PHYSICAL EXAM: BP 136/87   Pulse 86   Temp (!) 97.3 F (36.3 C) (Temporal)   Resp 17   Wt 194 lb 9.6 oz (88.3 kg)   SpO2 98%   BMI 31.41 kg/m   Physical Exam  General appearance - alert, well appearing, and in no distress Mental status - normal mood, behavior, speech, dress, motor activity, and thought processes Chest - clear to auscultation, no wheezes, rales or rhonchi, symmetric air entry Heart - normal rate, regular rhythm, normal S1, S2, no murmurs, rubs, clicks or gallops Skin -right: Atopic dermatitis that expands around hair in the axilla.   Tender non-fluctuant raised area middle of axilla.  LT axilla:.  Small indurated abscess without drainage anterior aspect of the axilla.  Surrounding atopic dermatitis-like rash around the hair of the axilla  CMP Latest Ref Rng & Units 06/08/2019  Glucose 70 - 99 mg/dL 82  BUN 6 - 20 mg/dL 11  Creatinine 1.610.61 - 0.961.24 mg/dL 0.451.03  Sodium 409135 - 811145 mmol/L 137  Potassium 3.5 - 5.1 mmol/L 3.7  Chloride 98 - 111 mmol/L 103  CO2 22 - 32 mmol/L 23  Calcium 8.9 - 10.3 mg/dL 9.4   Lipid Panel  No results found for: CHOL, TRIG, HDL, CHOLHDL, VLDL, LDLCALC, LDLDIRECT  CBC    Component Value Date/Time   WBC 8.6 06/08/2019 2224   RBC 5.16 06/08/2019 2224   HGB 16.2 06/08/2019 2224   HCT 46.7 06/08/2019 2224   PLT 267 06/08/2019 2224   MCV 90.5 06/08/2019 2224   MCH 31.4 06/08/2019 2224   MCHC 34.7 06/08/2019 2224   RDW 12.4 06/08/2019 2224   LYMPHSABS 4.6 (H) 06/08/2019 2224   MONOABS 1.0 06/08/2019 2224   EOSABS 0.2 06/08/2019 2224   BASOSABS 0.1 06/08/2019 2224    ASSESSMENT AND PLAN:  1. Allergic contact dermatitis due to other agents Advised patient to discontinue the soap and go back to using the [employer] soap. - triamcinolone cream (KENALOG) 0.1 %; Apply to affected area BID.  Do not use on face.  Dispense: 30 g; Refill: 0  2. Cutaneous abscesses of both axillae -Too small for I&D and does not have any fluctuance.  We will put him on Bactrim. Advised good handwashing - sulfamethoxazole-trimethoprim (BACTRIM) 400-80 MG tablet; Take 1 tablet by mouth 2 (two) times daily.  Dispense: 14 tablet; Refill: 0  3. Essential hypertension Not at goal.  Encourage compliance with amlodipine.  Recommend getting a pillbox so that he can fill it for the week to help him remembering taking his medications  4. Panic disorder Doing much better on Zoloft and hydroxyzine.   Patient was given the opportunity to ask questions.  Patient verbalized understanding of the plan and was able to repeat key  elements of the plan.   No orders of the defined types were placed in this encounter.    Requested Prescriptions   Signed Prescriptions Disp Refills  . triamcinolone cream (KENALOG) 0.1 % 30 g 0    Sig: Apply to affected area BID.  Do not use on face.  . sulfamethoxazole-trimethoprim (BACTRIM) 400-80 MG tablet 14 tablet 0    Sig: Take 1 tablet by mouth 2 (two) times daily.    Return in about 4 months (around 12/26/2019).  Jonah Blue, MD, FACP

## 2019-09-06 ENCOUNTER — Emergency Department (HOSPITAL_BASED_OUTPATIENT_CLINIC_OR_DEPARTMENT_OTHER)
Admission: EM | Admit: 2019-09-06 | Discharge: 2019-09-06 | Disposition: A | Discharge status: Home / Self Care | Payer: Self-pay | Attending: Emergency Medicine | Admitting: Emergency Medicine

## 2019-09-06 ENCOUNTER — Encounter (HOSPITAL_BASED_OUTPATIENT_CLINIC_OR_DEPARTMENT_OTHER): Payer: Self-pay | Admitting: Emergency Medicine

## 2019-09-06 ENCOUNTER — Other Ambulatory Visit: Payer: Self-pay

## 2019-09-06 DIAGNOSIS — Z23 Encounter for immunization: Secondary | ICD-10-CM | POA: Insufficient documentation

## 2019-09-06 DIAGNOSIS — L0291 Cutaneous abscess, unspecified: Secondary | ICD-10-CM

## 2019-09-06 DIAGNOSIS — F17290 Nicotine dependence, other tobacco product, uncomplicated: Secondary | ICD-10-CM | POA: Insufficient documentation

## 2019-09-06 DIAGNOSIS — L02414 Cutaneous abscess of left upper limb: Secondary | ICD-10-CM | POA: Insufficient documentation

## 2019-09-06 DIAGNOSIS — Z79899 Other long term (current) drug therapy: Secondary | ICD-10-CM | POA: Insufficient documentation

## 2019-09-06 DIAGNOSIS — I10 Essential (primary) hypertension: Secondary | ICD-10-CM | POA: Insufficient documentation

## 2019-09-06 MED ORDER — NAPROXEN 375 MG PO TABS: 375.0000 mg | ORAL_TABLET | Freq: Two times a day (BID) | ORAL | 0 refills | Status: DC

## 2019-09-06 MED ORDER — ACETAMINOPHEN 500 MG PO TABS
1000.0000 mg | ORAL_TABLET | Freq: Once | ORAL | Status: AC
  Administered 2019-09-06: 05:00:00 1000 mg via ORAL
  Filled 2019-09-06: qty 2

## 2019-09-06 MED ORDER — ACETAMINOPHEN 500 MG PO TABS
ORAL_TABLET | ORAL | Status: AC
  Filled 2019-09-06: qty 1

## 2019-09-06 MED ORDER — SULFAMETHOXAZOLE-TRIMETHOPRIM 800-160 MG PO TABS: 1.0000 | ORAL_TABLET | Freq: Two times a day (BID) | ORAL | 0 refills | Status: AC

## 2019-09-06 MED ORDER — IBUPROFEN 800 MG PO TABS
800.0000 mg | ORAL_TABLET | Freq: Once | ORAL | Status: AC
  Administered 2019-09-06: 05:00:00 800 mg via ORAL
  Filled 2019-09-06: qty 1

## 2019-09-06 MED ORDER — LIDOCAINE-EPINEPHRINE (PF) 2 %-1:200000 IJ SOLN: 20.0000 mL | Freq: Once | INTRAMUSCULAR | Status: DC

## 2019-09-06 MED ORDER — TETANUS-DIPHTH-ACELL PERTUSSIS 5-2.5-18.5 LF-MCG/0.5 IM SUSP
0.5000 mL | Freq: Once | INTRAMUSCULAR | Status: AC
  Administered 2019-09-06: 0.5 mL via INTRAMUSCULAR
  Filled 2019-09-06: qty 0.5

## 2019-09-06 MED ORDER — LIDOCAINE-EPINEPHRINE (PF) 2 %-1:200000 IJ SOLN
INTRAMUSCULAR | Status: AC
  Filled 2019-09-06: qty 10

## 2019-09-06 MED ORDER — SULFAMETHOXAZOLE-TRIMETHOPRIM 800-160 MG PO TABS
1.0000 | ORAL_TABLET | Freq: Once | ORAL | Status: AC
  Administered 2019-09-06: 1 via ORAL
  Filled 2019-09-06: qty 1

## 2019-09-06 NOTE — Discharge Instructions (Signed)
Apply neosporin twice daily and keep clean and dry

## 2019-09-06 NOTE — ED Triage Notes (Addendum)
Presents with abscess to L elbow x 1 week. Pt states increasing in size and very painful. EDP at bedside. Pt was seen at PCP 10/20 and Rx given for abx. Pt did not take his meds.

## 2019-09-06 NOTE — ED Provider Notes (Signed)
[facility] EMERGENCY DEPARTMENT Provider Note   CSN: [id number] Arrival date & time: 09/06/19  0425     History   Chief Complaint Chief Complaint  Patient presents with  . Abscess    HPI Mario Luna is a 28 y.o. male.     The history is provided by the patient.  Abscess Location:  Shoulder/arm Shoulder/arm abscess location:  L forearm (dorsal 3 inches distal to the olecranon with warmth and swelling) Abscess quality: draining, induration, painful and warmth   Red streaking: no   Duration:  1 week Progression:  Worsening Pain details:    Quality:  Dull   Severity:  Severe   Duration:  1 week   Timing:  Constant   Progression:  Worsening Chronicity:  Recurrent Context: not diabetes and not immunosuppression   Relieved by:  Nothing Worsened by:  Nothing Ineffective treatments:  None tried Associated symptoms: no anorexia, no fatigue, no fever, no headaches, no nausea and no vomiting   Risk factors: prior abscess   Seen for axillary abscesses on 10/20 but did not take antibiotics now presents with left forearm abscess.    Past Medical History:  Diagnosis Date  . A-fib (HCC)   . Anxiety   . Atrial fibrillation (HCC) 05/19/2015  . Heart murmur   . History of palpitations     Patient Active Problem List   Diagnosis Date Noted  . Essential hypertension 07/21/2019  . Onychomycosis of toenail 07/21/2019  . Tinea pedis of both feet 07/21/2019  . Tobacco use disorder 07/21/2019  . Panic disorder 06/16/2019  . Insomnia due to other mental disorder 06/16/2019    Past Surgical History:  Procedure Laterality Date  . ADENOIDECTOMY    . TONSILLECTOMY          Home Medications    Prior to Admission medications   Medication Sig Start Date End Date Taking? Authorizing Provider  amLODipine (NORVASC) 10 MG tablet Take 1 tablet (10 mg total) by mouth daily. 07/21/19   Storm FriskWright, Patrick E, MD  hydrOXYzine (ATARAX/VISTARIL) 50 MG tablet Take 1 tablet (50 mg  total) by mouth every 6 (six) hours as needed. 06/30/19   Marcine MatarJohnson, Deborah B, MD  naproxen (NAPROSYN) 375 MG tablet Take 1 tablet (375 mg total) by mouth 2 (two) times daily with a meal. 09/06/19   Kaynen Minner, MD  sertraline (ZOLOFT) 50 MG tablet Take 1 tablet (50 mg total) by mouth daily. 1/2 tab PO daily x 2 wks then 1 tab PO daily. 07/21/19   Storm FriskWright, Patrick E, MD  sulfamethoxazole-trimethoprim (BACTRIM DS) 800-160 MG tablet Take 1 tablet by mouth 2 (two) times daily for 7 days. 09/06/19 09/13/19  Quadasia Newsham, MD  sulfamethoxazole-trimethoprim (BACTRIM) 400-80 MG tablet Take 1 tablet by mouth 2 (two) times daily. 08/25/19   Marcine MatarJohnson, Deborah B, MD  triamcinolone cream (KENALOG) 0.1 % Apply to affected area BID.  Do not use on face. 08/25/19   Marcine MatarJohnson, Deborah B, MD    Family History Family History  Adopted: Yes    Social History Social History   Tobacco Use  . Smoking status: Current Some Day Smoker    Packs/day: 2.00    Years: 5.00    Pack years: 10.00    Types: Cigars  . Smokeless tobacco: Never Used  Substance Use Topics  . Alcohol use: Not Currently  . Drug use: No     Allergies   Patient has no known allergies.   Review of Systems Review of Systems  Constitutional: Negative for fatigue and fever.  HENT: Negative for congestion.   Eyes: Negative for visual disturbance.  Respiratory: Negative for cough.   Cardiovascular: Negative for chest pain.  Gastrointestinal: Negative for anorexia, nausea and vomiting.  Genitourinary: Negative for difficulty urinating.  Skin: Positive for wound.  Neurological: Negative for headaches.  Psychiatric/Behavioral: Negative for agitation.  All other systems reviewed and are negative.    Physical Exam Updated Vital Signs Ht 5\' 5"  (1.651 m)   Wt 86.2 kg   BMI 31.62 kg/m   Physical Exam Vitals signs and nursing note reviewed.  Constitutional:      General: He is not in acute distress.    Appearance: He is normal weight.   HENT:     Head: Normocephalic and atraumatic.     Nose: Nose normal.  Eyes:     Conjunctiva/sclera: Conjunctivae normal.     Pupils: Pupils are equal, round, and reactive to light.  Neck:     Musculoskeletal: Normal range of motion and neck supple.  Cardiovascular:     Rate and Rhythm: Normal rate and regular rhythm.     Pulses: Normal pulses.     Heart sounds: Normal heart sounds.  Pulmonary:     Effort: Pulmonary effort is normal.     Breath sounds: Normal breath sounds.  Abdominal:     General: Abdomen is flat. Bowel sounds are normal.     Tenderness: There is no abdominal tenderness. There is no guarding.  Musculoskeletal:     Left elbow: Normal.       Arms:     Left hand: He exhibits normal capillary refill. Normal sensation noted. Normal strength noted.  Skin:    General: Skin is warm and dry.     Capillary Refill: Capillary refill takes less than 2 seconds.  Neurological:     General: No focal deficit present.     Mental Status: He is alert and oriented to person, place, and time.     Deep Tendon Reflexes: Reflexes normal.  Psychiatric:        Mood and Affect: Mood normal.        Behavior: Behavior normal.      ED Treatments / Results  Labs (all labs ordered are listed, but only abnormal results are displayed) Labs Reviewed - No data to display  EKG None  Radiology No results found.  Procedures . Incision and Drainage  Date/Time: 09/06/2019 4:51 AM Performed by: 13/11/2018, MD Authorized by: Cy Blamer, MD   Consent:    Consent obtained:  Verbal   Consent given by:  Patient   Risks discussed:  Bleeding, damage to other organs, incomplete drainage, infection and pain   Alternatives discussed:  No treatment Location:    Type:  Abscess   Size:  2.5   Location:  Upper extremity   Upper extremity location:  Arm   Arm location:  L lower arm Pre-procedure details:    Skin preparation:  Chloraprep Anesthesia (see MAR for exact dosages):     Anesthesia method:  Local infiltration   Local anesthetic:  Lidocaine 1% WITH epi Procedure type:    Complexity:  Simple Procedure details:    Needle aspiration: no     Incision types:  Single straight   Incision depth:  Submucosal   Scalpel blade:  11   Wound management:  Probed and deloculated, irrigated with saline, extensive cleaning and debrided   Drainage:  Purulent   Drainage amount:  Moderate   Wound treatment:  Wound left open   Packing materials:  None Post-procedure details:    Patient tolerance of procedure:  Tolerated well, no immediate complications Comments:     Copiously cleansed prior to anesthesia with chlorhexidine and then betadine.  Cleansed again.  IC and deloculated, irrigated with sterile saline.  Bacitracin and bulky sterile dressing placed.     (including critical care time)  Medications Ordered in ED Medications  sulfamethoxazole-trimethoprim (BACTRIM DS) 800-160 MG per tablet 1 tablet (has no administration in time range)  lidocaine-EPINEPHrine (XYLOCAINE W/EPI) 2 %-1:200000 (PF) injection (has no administration in time range)  lidocaine-EPINEPHrine (XYLOCAINE W/EPI) 2 %-1:200000 (PF) injection 20 mL (has no administration in time range)  ibuprofen (ADVIL) tablet 800 mg (has no administration in time range)  acetaminophen (TYLENOL) tablet 1,000 mg (has no administration in time range)  Tdap (BOOSTRIX) injection 0.5 mL (has no administration in time range)    Bacitracin applied.  Patient educated that he must take all antibiotics and not stop just because he feels better.  Wound recheck at his doctor in 2 days.  And that he is to return immediately for fevers, vomiting, streaking up the arm or any concerns.  Wound care instructions given.   Initial Impression / Assessment and Plan / ED Course  Abscess:  Kayvon Mo was evaluated in Emergency Department on 09/06/2019 for the symptoms described in the history of present illness. He was evaluated in the  context of the global COVID-19 pandemic, which necessitated consideration that the patient might be at risk for infection with the SARS-CoV-2 virus that causes COVID-19. Institutional protocols and algorithms that pertain to the evaluation of patients at risk for COVID-19 are in a state of rapid change based on information released by regulatory bodies including the CDC and federal and state organizations. These policies and algorithms were followed during the patient's care in the ED. Final Clinical Impressions(s) / ED Diagnoses   Final diagnoses:  Abscess   Return for weakness, numbness, changes in vision or speech, fevers >100.4 unrelieved by medication, shortness of breath, intractable vomiting, or diarrhea, abdominal pain, Inability to tolerate liquids or food, cough, altered mental status or any concerns. No signs of systemic illness or infection. The patient is nontoxic-appearing on exam and vital signs are within normal limits.   I have reviewed the triage vital signs and the nursing notes. Pertinent labs &imaging results that were available during my care of the patient were reviewed by me and considered in my medical decision making (see chart for details).  After history, exam, and medical workup I feel the patient has been appropriately medically screened and is safe for discharge home. Pertinent diagnoses were discussed with the patient. Patient was given return precautions ED Discharge Orders         Ordered    sulfamethoxazole-trimethoprim (BACTRIM DS) 800-160 MG tablet  2 times daily     09/06/19 0447    naproxen (NAPROSYN) 375 MG tablet  2 times daily with meals     09/06/19 0447           Lydiana Milley, MD 09/06/19 5809

## 2019-10-10 ENCOUNTER — Ambulatory Visit
Admission: EM | Admit: 2019-10-10 | Discharge: 2019-10-10 | Disposition: A | Discharge status: Home / Self Care | Payer: Self-pay | Attending: Emergency Medicine | Admitting: Emergency Medicine

## 2019-10-10 ENCOUNTER — Other Ambulatory Visit: Payer: Self-pay

## 2019-10-10 ENCOUNTER — Encounter: Payer: Self-pay | Admitting: Emergency Medicine

## 2019-10-10 DIAGNOSIS — L739 Follicular disorder, unspecified: Secondary | ICD-10-CM

## 2019-10-10 MED ORDER — CEPHALEXIN 500 MG PO CAPS: 500.0000 mg | ORAL_CAPSULE | Freq: Two times a day (BID) | ORAL | 0 refills | Status: AC

## 2019-10-10 NOTE — ED Provider Notes (Signed)
[facility]    CSN: [id number] Arrival date & time: 10/10/19  1045      History   Chief Complaint Chief Complaint  Patient presents with  . Abscess    HPI Mario Luna is a 28 y.o. male with history of A. fib presenting for 3-day course of left lower extremity swelling, pain, erythema.  Has not tried any thing for this.  Denies inciting event, bug bite.  Patient states he had a little bit of pain while at work, so he missed work today to get assessed.  Requesting work note.  No fever, chills, arthralgias, difficulty ambulating.   Past Medical History:  Diagnosis Date  . A-fib (HCC)   . Anxiety   . Atrial fibrillation (HCC) 05/19/2015  . Heart murmur   . History of palpitations     Patient Active Problem List   Diagnosis Date Noted  . Essential hypertension 07/21/2019  . Onychomycosis of toenail 07/21/2019  . Tinea pedis of both feet 07/21/2019  . Tobacco use disorder 07/21/2019  . Panic disorder 06/16/2019  . Insomnia due to other mental disorder 06/16/2019    Past Surgical History:  Procedure Laterality Date  . ADENOIDECTOMY    . TONSILLECTOMY         Home Medications    Prior to Admission medications   Medication Sig Start Date End Date Taking? Authorizing Provider  amLODipine (NORVASC) 10 MG tablet Take 1 tablet (10 mg total) by mouth daily. 07/21/19   Storm Frisk, MD  cephALEXin (KEFLEX) 500 MG capsule Take 1 capsule (500 mg total) by mouth 2 (two) times daily for 5 days. 10/10/19 10/15/19  Hall-Potvin, [place], PA-C  hydrOXYzine (ATARAX/VISTARIL) 50 MG tablet Take 1 tablet (50 mg total) by mouth every 6 (six) hours as needed. 06/30/19   Marcine Matar, MD  sertraline (ZOLOFT) 50 MG tablet Take 1 tablet (50 mg total) by mouth daily. 1/2 tab PO daily x 2 wks then 1 tab PO daily. 07/21/19   Storm Frisk, MD    Family History Family History  Adopted: Yes  Problem Relation Age of Onset  . Healthy Father   . Healthy Mother      Social History Social History   Tobacco Use  . Smoking status: Current Some Day Smoker    Packs/day: 2.00    Years: 5.00    Pack years: 10.00    Types: Cigars  . Smokeless tobacco: Never Used  Substance Use Topics  . Alcohol use: Not Currently  . Drug use: No     Allergies   Patient has no known allergies.   Review of Systems Review of Systems  Constitutional: Negative for fatigue and fever.  Respiratory: Negative for cough and shortness of breath.   Cardiovascular: Negative for chest pain and palpitations.  Gastrointestinal: Negative for abdominal pain, diarrhea and vomiting.  Musculoskeletal: Negative for arthralgias and myalgias.  Skin: Positive for wound. Negative for rash.  Neurological: Negative for speech difficulty and headaches.  All other systems reviewed and are negative.    Physical Exam Triage Vital Signs ED Triage Vitals  Enc Vitals Group     BP 10/10/19 1052 (!) 137/98     Pulse Rate 10/10/19 1052 (!) 110     Resp 10/10/19 1052 18     Temp 10/10/19 1052 98.1 F (36.7 C)     Temp Source 10/10/19 1052 Oral     SpO2 10/10/19 1052 96 %     Weight --  Height --      Head Circumference --      Peak Flow --      Pain Score 10/10/19 1053 5     Pain Loc --      Pain Edu? --      Excl. in GC? --    No data found.  Updated Vital Signs BP (!) 137/98 (BP Location: Right Arm)   Pulse (!) 110   Temp 98.1 F (36.7 C) (Oral)   Resp 18   SpO2 96%   Visual Acuity Right Eye Distance:   Left Eye Distance:   Bilateral Distance:    Right Eye Near:   Left Eye Near:    Bilateral Near:     Physical Exam Constitutional:      General: He is not in acute distress.    Appearance: He is normal weight. He is not ill-appearing.  HENT:     Head: Normocephalic and atraumatic.     Mouth/Throat:     Mouth: Mucous membranes are moist.     Pharynx: Oropharynx is clear.  Eyes:     General: No scleral icterus.    Conjunctiva/sclera: Conjunctivae normal.      Pupils: Pupils are equal, round, and reactive to light.  Cardiovascular:     Rate and Rhythm: Normal rate and regular rhythm.     Comments: HR 90 at bedside Pulmonary:     Effort: Pulmonary effort is normal. No respiratory distress.     Breath sounds: No wheezing.  Musculoskeletal: Normal range of motion.     Right lower leg: No edema.     Left lower leg: No edema.  Skin:    Comments: Left lower leg posterolateral aspect with 2 small whiteheads with greater than 2 cm area of surrounding erythema.  Warm and tender to palpation.  No fluctuance appreciated.  No streaking, active discharge.  Neurological:     Mental Status: He is alert and oriented to person, place, and time.      UC Treatments / Results  Labs (all labs ordered are listed, but only abnormal results are displayed) Labs Reviewed - No data to display  EKG   Radiology No results found.  Procedures Procedures (including critical care time)  Medications Ordered in UC Medications - No data to display  Initial Impression / Assessment and Plan / UC Course  I have reviewed the triage vital signs and the nursing notes.  Pertinent labs & imaging results that were available during my care of the patient were reviewed by me and considered in my medical decision making (see chart for details).     Afebrile, nontoxic, otherwise healthy without immunocompromise state.  Will give short-term course of Keflex, increase hot compress use.  Reviewed expected healing course, provided work note.  Return precautions discussed, patient verbalized understanding and is agreeable to plan. Final Clinical Impressions(s) / UC Diagnoses   Final diagnoses:  Folliculitis     Discharge Instructions     Keep area(s) clean and dry. Apply hot compress / towel for 5-10 minutes 3-5 times daily. Take antibiotic as prescribed with food - important to complete course. Return for worsening pain, redness, swelling, discharge, fever.   Helpful prevention tips: Keep nails short to avoid secondary skin infections. Use new, clean razors when shaving. Avoid antiperspirants - look for deodorants without aluminum. Avoid wearing underwire bras as this can irritate the area further.     ED Prescriptions    Medication Sig Dispense Auth. Provider  cephALEXin (KEFLEX) 500 MG capsule Take 1 capsule (500 mg total) by mouth 2 (two) times daily for 5 days. 10 capsule Hall-Potvin, [place], PA-C     PDMP not reviewed this encounter.   Hall-Potvin, [place], [place] 10/10/19 1125

## 2019-10-10 NOTE — ED Notes (Signed)
Patient able to ambulate independently  

## 2019-10-10 NOTE — Discharge Instructions (Signed)
Keep area(s) clean and dry. °Apply hot compress / towel for 5-10 minutes 3-5 times daily. °Take antibiotic as prescribed with food - important to complete course. °Return for worsening pain, redness, swelling, discharge, fever. ° °Helpful prevention tips: °Keep nails short to avoid secondary skin infections. °Use new, clean razors when shaving. °Avoid antiperspirants - look for deodorants without aluminum. °Avoid wearing underwire bras as this can irritate the area further.  °

## 2019-10-10 NOTE — ED Triage Notes (Signed)
Pt presents to [facility] for assessment of left leg swelling x 3 days with two reddened area with heads on them.  Denies discharge.

## 2019-11-24 ENCOUNTER — Ambulatory Visit: Payer: Self-pay

## 2019-12-09 ENCOUNTER — Telehealth: Payer: Self-pay | Admitting: Internal Medicine

## 2019-12-09 NOTE — Telephone Encounter (Signed)
Called and confirmed patients MyChart video visit for tomorrow 12/10/2019

## 2019-12-10 ENCOUNTER — Telehealth (INDEPENDENT_AMBULATORY_CARE_PROVIDER_SITE_OTHER): Payer: Self-pay | Admitting: Internal Medicine

## 2019-12-10 DIAGNOSIS — L0292 Furuncle, unspecified: Secondary | ICD-10-CM

## 2019-12-10 MED ORDER — DOXYCYCLINE HYCLATE 100 MG PO TABS: 100.0000 mg | ORAL_TABLET | Freq: Two times a day (BID) | ORAL | 0 refills | Status: DC

## 2019-12-10 NOTE — Progress Notes (Signed)
Virtual Visit via Telephone Note  I connected with Mario Luna, on 12/10/2019 at 11:18 AM by telephone due to the COVID-19 pandemic and verified that I am speaking with the correct person using two identifiers.   Consent: I discussed the limitations, risks, security and privacy concerns of performing an evaluation and management service by telephone and the availability of in person appointments. I also discussed with the patient that there may be a patient responsible charge related to this service. The patient expressed understanding and agreed to proceed.   Location of Patient: Home   Location of Provider: Clinic    Persons participating in Telemedicine visit: Keyshaun Exley [facility] Dr. Earlene Plater      History of Present Illness: Patient concerned about a bump that popped up on his leg, located on shin of right leg. It was swollen and very tender. This is the second time this has happened. Reports there is still redness around where the bump was and a defect in the leg where the bump is. Still a little sore. Had his girlfriend pop it for him and yellow pus/fluid came out. Denies fevers or vomiting. Did feel nauseous a couple times. Didn't take anything for pain. No use of warm compresses.    Past Medical History:  Diagnosis Date  . A-fib (HCC)   . Anxiety   . Atrial fibrillation (HCC) 05/19/2015  . Heart murmur   . History of palpitations    No Known Allergies  Current Outpatient Medications on File Prior to Visit  Medication Sig Dispense Refill  . amLODipine (NORVASC) 10 MG tablet Take 1 tablet (10 mg total) by mouth daily. 30 tablet 3  . hydrOXYzine (ATARAX/VISTARIL) 50 MG tablet Take 1 tablet (50 mg total) by mouth every 6 (six) hours as needed. 120 tablet 1  . sertraline (ZOLOFT) 50 MG tablet Take 1 tablet (50 mg total) by mouth daily. 1/2 tab PO daily x 2 wks then 1 tab PO daily. 30 tablet 3   No current facility-administered medications on file prior to visit.     Observations/Objective: NAD. Speaking clearly.  Work of breathing normal.  Alert and oriented. Mood appropriate.   Assessment and Plan: 1. Boil Was supposed to have a video visit with patient but he had connection issues. Sounds like he had an abscess of the skin. Some concern remains for associated soft tissue infection given report of erythema in surrounding area and residual pain/tenderness. Will prescribe Doxy and recommended warm compresses. Return precautions discussed.  - doxycycline (VIBRA-TABS) 100 MG tablet; Take 1 tablet (100 mg total) by mouth 2 (two) times daily.  Dispense: 14 tablet; Refill: 0   Follow Up Instructions: Return for annual exam or sooner if needed    I discussed the assessment and treatment plan with the patient. The patient was provided an opportunity to ask questions and all were answered. The patient agreed with the plan and demonstrated an understanding of the instructions.   The patient was advised to call back or seek an in-person evaluation if the symptoms worsen or if the condition fails to improve as anticipated.     I provided 14 minutes total of non-face-to-face time during this encounter including median intraservice time, reviewing previous notes, investigations, ordering medications, medical decision making, coordinating care and patient verbalized understanding at the end of the visit.    Marcy Siren, D.O. Primary Care at [facility]  12/10/2019, 11:18 AM

## 2019-12-29 ENCOUNTER — Ambulatory Visit: Payer: Self-pay

## 2020-05-04 ENCOUNTER — Encounter: Payer: Self-pay | Admitting: Emergency Medicine

## 2020-05-04 ENCOUNTER — Ambulatory Visit
Admission: EM | Admit: 2020-05-04 | Discharge: 2020-05-04 | Disposition: A | Discharge status: Home / Self Care | Payer: Self-pay | Attending: Physician Assistant | Admitting: Physician Assistant

## 2020-05-04 ENCOUNTER — Other Ambulatory Visit: Payer: Self-pay

## 2020-05-04 DIAGNOSIS — T148XXA Other injury of unspecified body region, initial encounter: Secondary | ICD-10-CM

## 2020-05-04 MED ORDER — NAPROXEN 500 MG PO TABS: 500.0000 mg | ORAL_TABLET | Freq: Two times a day (BID) | ORAL | 0 refills | Status: DC

## 2020-05-04 MED ORDER — MUPIROCIN 2 % EX OINT: 1.0000 "application " | TOPICAL_OINTMENT | Freq: Two times a day (BID) | CUTANEOUS | 0 refills | Status: DC

## 2020-05-04 NOTE — ED Triage Notes (Signed)
Pt presents to [facility] for assessment of abscess to right buttocks x 3 days.

## 2020-05-04 NOTE — Discharge Instructions (Signed)
Bactroban as directed. Naproxen as needed. Warm compress, warmth to be on the area for 10-15 mins. Alternatively, you can soak in warm bath. Do this 1-3 times a day. You can use gauze for cushioning. Monitor for spreading redness, warmth, fever, follow up for reevaluation.

## 2020-05-04 NOTE — ED Notes (Signed)
Patient able to ambulate independently  

## 2020-05-04 NOTE — ED Provider Notes (Signed)
[facility]    CSN: [id number] Arrival date & time: 05/04/20  0806      History   Chief Complaint Chief Complaint  Patient presents with  . Abscess    HPI Mario Luna is a 28 y.o. male.   29 year old male comes in for 3 day history of possible abscess to the right buttocks. Since noticing, feels that area is getting larger. Area is painful to touch. Denies fevers. No home remedies.      Past Medical History:  Diagnosis Date  . A-fib (HCC)   . Anxiety   . Atrial fibrillation (HCC) 05/19/2015  . Heart murmur   . History of palpitations     Patient Active Problem List   Diagnosis Date Noted  . Essential hypertension 07/21/2019  . Onychomycosis of toenail 07/21/2019  . Tinea pedis of both feet 07/21/2019  . Tobacco use disorder 07/21/2019  . Panic disorder 06/16/2019  . Insomnia due to other mental disorder 06/16/2019    Past Surgical History:  Procedure Laterality Date  . ADENOIDECTOMY    . TONSILLECTOMY         Home Medications    Prior to Admission medications   Medication Sig Start Date End Date Taking? Authorizing Provider  hydrOXYzine (ATARAX/VISTARIL) 50 MG tablet Take 1 tablet (50 mg total) by mouth every 6 (six) hours as needed. 06/30/19   Marcine Matar, MD  mupirocin ointment (BACTROBAN) 2 % Apply 1 application topically 2 (two) times daily. 05/04/20   Cathie Hoops, Petr Bontempo V, PA-C  naproxen (NAPROSYN) 500 MG tablet Take 1 tablet (500 mg total) by mouth 2 (two) times daily. 05/04/20   Cathie Hoops, Reneshia Zuccaro V, PA-C  sertraline (ZOLOFT) 50 MG tablet Take 1 tablet (50 mg total) by mouth daily. 1/2 tab PO daily x 2 wks then 1 tab PO daily. 07/21/19   Storm Frisk, MD  amLODipine (NORVASC) 10 MG tablet Take 1 tablet (10 mg total) by mouth daily. 07/21/19 05/04/20  Storm Frisk, MD    Family History Family History  Adopted: Yes  Problem Relation Age of Onset  . Healthy Father   . Healthy Mother     Social History Social History   Tobacco Use  .  Smoking status: Current Some Day Smoker    Packs/day: 2.00    Years: 5.00    Pack years: 10.00    Types: Cigars  . Smokeless tobacco: Never Used  Vaping Use  . Vaping Use: Never used  Substance Use Topics  . Alcohol use: Not Currently  . Drug use: No     Allergies   Patient has no known allergies.   Review of Systems Review of Systems  Reason unable to perform ROS: See HPI as above.     Physical Exam Triage Vital Signs ED Triage Vitals  Enc Vitals Group     BP 05/04/20 0810 (!) 147/89     Pulse Rate 05/04/20 0810 (!) 101     Resp 05/04/20 0810 18     Temp 05/04/20 0810 98.2 F (36.8 C)     Temp Source 05/04/20 0810 Oral     SpO2 05/04/20 0810 97 %     Weight --      Height --      Head Circumference --      Peak Flow --      Pain Score 05/04/20 0820 3     Pain Loc --      Pain Edu? --  Excl. in GC? --    No data found.  Updated Vital Signs BP (!) 147/89 (BP Location: Left Arm)   Pulse (!) 101   Temp 98.2 F (36.8 C) (Oral)   Resp 18   SpO2 97%   Physical Exam Constitutional:      General: He is not in acute distress.    Appearance: Normal appearance. He is well-developed. He is not toxic-appearing or diaphoretic.  HENT:     Head: Normocephalic and atraumatic.  Eyes:     Conjunctiva/sclera: Conjunctivae normal.     Pupils: Pupils are equal, round, and reactive to light.  Pulmonary:     Effort: Pulmonary effort is normal. No respiratory distress.     Comments: Speaking in full sentences without difficulty Musculoskeletal:     Cervical back: Normal range of motion and neck supple.  Skin:    General: Skin is warm and dry.     Comments: 0.2cm x 0.2cm wound with surrounding induration. No erythema, warmth, fluctuance.   Neurological:     Mental Status: He is alert and oriented to person, place, and time.    UC Treatments / Results  Labs (all labs ordered are listed, but only abnormal results are displayed) Labs Reviewed - No data to  display  EKG   Radiology No results found.  Procedures Procedures (including critical care time)  Medications Ordered in UC Medications - No data to display  Initial Impression / Assessment and Plan / UC Course  I have reviewed the triage vital signs and the nursing notes.  Pertinent labs & imaging results that were available during my care of the patient were reviewed by me and considered in my medical decision making (see chart for details).    No signs of cellulitis, abscess. Will have patient dress with bactroban and warm compress for now. Return precautions given.  Final Clinical Impressions(s) / UC Diagnoses   Final diagnoses:  Open wound of skin   ED Prescriptions    Medication Sig Dispense Auth. Provider   mupirocin ointment (BACTROBAN) 2 % Apply 1 application topically 2 (two) times daily. 22 g Aleesa Sweigert V, PA-C   naproxen (NAPROSYN) 500 MG tablet Take 1 tablet (500 mg total) by mouth 2 (two) times daily. 20 tablet Belinda Fisher, PA-C     PDMP not reviewed this encounter.   Belinda Fisher, PA-C 05/04/20 [phone]

## 2020-07-05 ENCOUNTER — Other Ambulatory Visit: Payer: Self-pay

## 2020-07-05 ENCOUNTER — Ambulatory Visit
Admission: EM | Admit: 2020-07-05 | Discharge: 2020-07-05 | Disposition: A | Discharge status: Home / Self Care | Payer: Self-pay | Attending: Emergency Medicine | Admitting: Emergency Medicine

## 2020-07-05 ENCOUNTER — Encounter: Payer: Self-pay | Admitting: Emergency Medicine

## 2020-07-05 DIAGNOSIS — L0291 Cutaneous abscess, unspecified: Secondary | ICD-10-CM

## 2020-07-05 MED ORDER — IBUPROFEN 800 MG PO TABS: 800.0000 mg | ORAL_TABLET | Freq: Three times a day (TID) | ORAL | 0 refills | Status: DC

## 2020-07-05 MED ORDER — ONDANSETRON 4 MG PO TBDP: 4.0000 mg | ORAL_TABLET | Freq: Three times a day (TID) | ORAL | 0 refills | Status: DC | PRN

## 2020-07-05 MED ORDER — HYDROCODONE-ACETAMINOPHEN 5-325 MG PO TABS: 1.0000 | ORAL_TABLET | Freq: Four times a day (QID) | ORAL | 0 refills | Status: DC | PRN

## 2020-07-05 MED ORDER — DOXYCYCLINE HYCLATE 100 MG PO CAPS: 100.0000 mg | ORAL_CAPSULE | Freq: Two times a day (BID) | ORAL | 0 refills | Status: AC

## 2020-07-05 MED ORDER — DOXYCYCLINE HYCLATE 100 MG PO CAPS: 100.0000 mg | ORAL_CAPSULE | Freq: Two times a day (BID) | ORAL | 0 refills | Status: DC

## 2020-07-05 NOTE — ED Triage Notes (Signed)
Pt diagnosed with covid last week; pt sts improving sx but now has multiple abscesses on his arms, buttocks and back of head with pain and swelling

## 2020-07-05 NOTE — Discharge Instructions (Signed)
Begin doxycycline twice daily for 10 days Warm compresses with gentle massage Use anti-inflammatories for pain/swelling. You may take up to 800 mg Ibuprofen every 8 hours with food. You may supplement Ibuprofen with Tylenol [phone] mg every 8 hours.  Hydrocodone for severe pain  Follow-up if not improving or worsening

## 2020-07-05 NOTE — ED Provider Notes (Signed)
[facility]    CSN: [id number] Arrival date & time: 07/05/20  1652      History   Chief Complaint Chief Complaint  Patient presents with   Abscess    HPI Mario Luna is a 29 y.o. male presenting today for evaluation of abscesses.  Patient reports that he has developed multiple abscesses to various parts of his body over the past week.  Most notably to his forearm, buttocks as well as scalp.  Denies history of similar.  Recently diagnosed with Covid. Denies fevers.   HPI  Past Medical History:  Diagnosis Date   A-fib [facility])    Anxiety    Atrial fibrillation (HCC) 05/19/2015   Heart murmur    History of palpitations     Patient Active Problem List   Diagnosis Date Noted   Essential hypertension 07/21/2019   Onychomycosis of toenail 07/21/2019   Tinea pedis of both feet 07/21/2019   Tobacco use disorder 07/21/2019   Panic disorder 06/16/2019   Insomnia due to other mental disorder 06/16/2019    Past Surgical History:  Procedure Laterality Date   ADENOIDECTOMY     TONSILLECTOMY         Home Medications    Prior to Admission medications   Medication Sig Start Date End Date Taking? Authorizing Provider  doxycycline (VIBRAMYCIN) 100 MG capsule Take 1 capsule (100 mg total) by mouth 2 (two) times daily for 10 days. 07/05/20 07/15/20  Jabez Molner C, PA-C  HYDROcodone-acetaminophen (NORCO/VICODIN) 5-325 MG tablet Take 1-2 tablets by mouth every 6 (six) hours as needed for severe pain. 07/05/20   Shylo Dillenbeck C, PA-C  hydrOXYzine (ATARAX/VISTARIL) 50 MG tablet Take 1 tablet (50 mg total) by mouth every 6 (six) hours as needed. Patient not taking: Reported on 07/05/2020 06/30/19   Marcine Matar, MD  ibuprofen (ADVIL) 800 MG tablet Take 1 tablet (800 mg total) by mouth 3 (three) times daily. 07/05/20   Huy Majid C, PA-C  mupirocin ointment (BACTROBAN) 2 % Apply 1 application topically 2 (two) times daily. 05/04/20   Cathie Hoops, Amy V, PA-C    naproxen (NAPROSYN) 500 MG tablet Take 1 tablet (500 mg total) by mouth 2 (two) times daily. 05/04/20   Cathie Hoops, Amy V, PA-C  ondansetron (ZOFRAN ODT) 4 MG disintegrating tablet Take 1 tablet (4 mg total) by mouth every 8 (eight) hours as needed for nausea or vomiting. 07/05/20   Evelynn Hench C, PA-C  sertraline (ZOLOFT) 50 MG tablet Take 1 tablet (50 mg total) by mouth daily. 1/2 tab PO daily x 2 wks then 1 tab PO daily. 07/21/19   Storm Frisk, MD  amLODipine (NORVASC) 10 MG tablet Take 1 tablet (10 mg total) by mouth daily. 07/21/19 05/04/20  Storm Frisk, MD    Family History Family History  Adopted: Yes  Problem Relation Age of Onset   Healthy Father    Healthy Mother     Social History Social History   Tobacco Use   Smoking status: Current Some Day Smoker    Packs/day: 2.00    Years: 5.00    Pack years: 10.00    Types: Cigars   Smokeless tobacco: Never Used  Building services engineer Use: Never used  Substance Use Topics   Alcohol use: Not Currently   Drug use: No     Allergies   Patient has no known allergies.   Review of Systems Review of Systems  Constitutional: Negative for fatigue and fever.  Eyes: Negative for redness, itching and visual disturbance.  Respiratory: Negative for shortness of breath.   Cardiovascular: Negative for chest pain and leg swelling.  Gastrointestinal: Negative for nausea and vomiting.  Musculoskeletal: Negative for arthralgias and myalgias.  Skin: Positive for color change and rash. Negative for wound.  Neurological: Negative for dizziness, syncope, weakness, light-headedness and headaches.     Physical Exam Triage Vital Signs ED Triage Vitals [07/05/20 1727]  Enc Vitals Group     BP (!) 161/99     Pulse Rate (!) 103     Resp 18     Temp 98.8 F (37.1 C)     Temp Source Oral     SpO2 96 %     Weight      Height      Head Circumference      Peak Flow      Pain Score 6     Pain Loc      Pain Edu?      Excl. in  GC?    No data found.  Updated Vital Signs BP (!) 161/99 (BP Location: Right Arm)    Pulse (!) 103    Temp 98.8 F (37.1 C) (Oral)    Resp 18    SpO2 96%   Visual Acuity Right Eye Distance:   Left Eye Distance:   Bilateral Distance:    Right Eye Near:   Left Eye Near:    Bilateral Near:     Physical Exam Vitals and nursing note reviewed.  Constitutional:      Appearance: He is well-developed.     Comments: No acute distress  HENT:     Head: Normocephalic and atraumatic.     Nose: Nose normal.  Eyes:     Conjunctiva/sclera: Conjunctivae normal.  Cardiovascular:     Rate and Rhythm: Normal rate.  Pulmonary:     Effort: Pulmonary effort is normal. No respiratory distress.  Abdominal:     General: There is no distension.  Musculoskeletal:        General: Normal range of motion.     Cervical back: Neck supple.  Skin:    General: Skin is warm and dry.     Comments: Multiple areas of swelling erythema and induration noted to right forearm, occipital area of scalp, bilateral axilla as well as bilateral buttock  Neurological:     Mental Status: He is alert and oriented to person, place, and time.      UC Treatments / Results  Labs (all labs ordered are listed, but only abnormal results are displayed) Labs Reviewed - No data to display  EKG   Radiology No results found.  Procedures Procedures (including critical care time)  Medications Ordered in UC Medications - No data to display  Initial Impression / Assessment and Plan / UC Course  I have reviewed the triage vital signs and the nursing notes.  Pertinent labs & imaging results that were available during my care of the patient were reviewed by me and considered in my medical decision making (see chart for details).    Multiple abscesses, mainly with induration, minimal fluctuance, do not seem to warrant I&D at this time.  Initiating doxycycline, recommending warm compresses with continued close monitoring.   Recommended to return if any areas not resolving with oral antibiotics alone.'  Discussed strict return precautions. Patient verbalized understanding and is agreeable with plan.  Final Clinical Impressions(s) / UC Diagnoses   Final diagnoses:  Abscess of multiple sites  Discharge Instructions     Begin doxycycline twice daily for 10 days Warm compresses with gentle massage Use anti-inflammatories for pain/swelling. You may take up to 800 mg Ibuprofen every 8 hours with food. You may supplement Ibuprofen with Tylenol [phone] mg every 8 hours.  Hydrocodone for severe pain  Follow-up if not improving or worsening    ED Prescriptions    Medication Sig Dispense Auth. Provider   doxycycline (VIBRAMYCIN) 100 MG capsule  (Status: Discontinued) Take 1 capsule (100 mg total) by mouth 2 (two) times daily for 10 days. 20 capsule Nicole Defino C, PA-C   ibuprofen (ADVIL) 800 MG tablet  (Status: Discontinued) Take 1 tablet (800 mg total) by mouth 3 (three) times daily. 21 tablet Donye Dauenhauer C, PA-C   HYDROcodone-acetaminophen (NORCO/VICODIN) 5-325 MG tablet  (Status: Discontinued) Take 1-2 tablets by mouth every 6 (six) hours as needed for severe pain. 10 tablet Nettye Flegal C, PA-C   doxycycline (VIBRAMYCIN) 100 MG capsule Take 1 capsule (100 mg total) by mouth 2 (two) times daily for 10 days. 20 capsule Ramonia Mcclaran C, PA-C   ibuprofen (ADVIL) 800 MG tablet Take 1 tablet (800 mg total) by mouth 3 (three) times daily. 21 tablet Kyce Ging C, PA-C   ondansetron (ZOFRAN ODT) 4 MG disintegrating tablet Take 1 tablet (4 mg total) by mouth every 8 (eight) hours as needed for nausea or vomiting. 20 tablet Navi Ewton C, PA-C   HYDROcodone-acetaminophen (NORCO/VICODIN) 5-325 MG tablet Take 1-2 tablets by mouth every 6 (six) hours as needed for severe pain. 8 tablet Olive Zmuda, Alba C, PA-C     I have reviewed the PDMP during this encounter.   Lew Dawes, PA-C 07/06/20  1159

## 2020-07-28 ENCOUNTER — Emergency Department (HOSPITAL_BASED_OUTPATIENT_CLINIC_OR_DEPARTMENT_OTHER)
Admission: EM | Admit: 2020-07-28 | Discharge: 2020-07-28 | Disposition: A | Discharge status: Home / Self Care | Payer: Self-pay | Attending: Emergency Medicine | Admitting: Emergency Medicine

## 2020-07-28 ENCOUNTER — Encounter (HOSPITAL_BASED_OUTPATIENT_CLINIC_OR_DEPARTMENT_OTHER): Payer: Self-pay | Admitting: Emergency Medicine

## 2020-07-28 ENCOUNTER — Other Ambulatory Visit: Payer: Self-pay

## 2020-07-28 DIAGNOSIS — I10 Essential (primary) hypertension: Secondary | ICD-10-CM | POA: Insufficient documentation

## 2020-07-28 DIAGNOSIS — F17200 Nicotine dependence, unspecified, uncomplicated: Secondary | ICD-10-CM | POA: Insufficient documentation

## 2020-07-28 DIAGNOSIS — K0889 Other specified disorders of teeth and supporting structures: Secondary | ICD-10-CM | POA: Insufficient documentation

## 2020-07-28 DIAGNOSIS — Z79899 Other long term (current) drug therapy: Secondary | ICD-10-CM | POA: Insufficient documentation

## 2020-07-28 MED ORDER — NAPROXEN 250 MG PO TABS
500.0000 mg | ORAL_TABLET | Freq: Once | ORAL | Status: AC
  Administered 2020-07-28: 500 mg via ORAL
  Filled 2020-07-28: qty 2

## 2020-07-28 MED ORDER — LIDOCAINE VISCOUS HCL 2 % MT SOLN
15.0000 mL | Freq: Once | OROMUCOSAL | Status: AC
  Administered 2020-07-28: 15 mL via OROMUCOSAL
  Filled 2020-07-28: qty 15

## 2020-07-28 MED ORDER — PENICILLIN V POTASSIUM 500 MG PO TABS: 500.0000 mg | ORAL_TABLET | Freq: Four times a day (QID) | ORAL | 0 refills | Status: DC

## 2020-07-28 MED ORDER — PENICILLIN V POTASSIUM 250 MG PO TABS
500.0000 mg | ORAL_TABLET | Freq: Once | ORAL | Status: AC
  Administered 2020-07-28: 500 mg via ORAL
  Filled 2020-07-28: qty 2

## 2020-07-28 MED ORDER — NAPROXEN 500 MG PO TABS: 500.0000 mg | ORAL_TABLET | Freq: Two times a day (BID) | ORAL | 0 refills | Status: DC

## 2020-07-28 MED ORDER — CHLORHEXIDINE GLUCONATE 0.12 % MT SOLN: 15.0000 mL | Freq: Two times a day (BID) | OROMUCOSAL | 0 refills | Status: DC

## 2020-07-28 NOTE — ED Provider Notes (Signed)
[facility] EMERGENCY DEPARTMENT Provider Note   CSN: [id number] Arrival date & time: 07/28/20  0118     History No chief complaint on file.   Mario Luna is a 29 y.o. male.  The history is provided by the patient.  Dental Pain Location:  Upper Upper teeth location:  1/RU 3rd molar Quality:  Aching Severity:  Severe Onset quality:  Gradual Duration:  2 weeks Timing:  Constant Progression:  Unchanged Chronicity:  New Context: not recent dental surgery   Prior workup: none  Relieved by:  Nothing Worsened by:  Nothing Ineffective treatments:  None tried Associated symptoms: no facial swelling and no fever   Risk factors: no diabetes   2 weeks of dental pain but has not seen a dentist.  No f/c/r.       Past Medical History:  Diagnosis Date  . A-fib (HCC)   . Anxiety   . Atrial fibrillation (HCC) 05/19/2015  . Heart murmur   . History of palpitations     Patient Active Problem List   Diagnosis Date Noted  . Essential hypertension 07/21/2019  . Onychomycosis of toenail 07/21/2019  . Tinea pedis of both feet 07/21/2019  . Tobacco use disorder 07/21/2019  . Panic disorder 06/16/2019  . Insomnia due to other mental disorder 06/16/2019    Past Surgical History:  Procedure Laterality Date  . ADENOIDECTOMY    . TONSILLECTOMY         Family History  Adopted: Yes  Problem Relation Age of Onset  . Healthy Father   . Healthy Mother     Social History   Tobacco Use  . Smoking status: Current Some Day Smoker    Packs/day: 2.00    Years: 5.00    Pack years: 10.00    Types: Cigars  . Smokeless tobacco: Never Used  Vaping Use  . Vaping Use: Never used  Substance Use Topics  . Alcohol use: Not Currently  . Drug use: No    Home Medications Prior to Admission medications   Medication Sig Start Date End Date Taking? Authorizing Provider  HYDROcodone-acetaminophen (NORCO/VICODIN) 5-325 MG tablet Take 1-2 tablets by mouth every 6 (six) hours as  needed for severe pain. 07/05/20   Wieters, Hallie C, PA-C  hydrOXYzine (ATARAX/VISTARIL) 50 MG tablet Take 1 tablet (50 mg total) by mouth every 6 (six) hours as needed. Patient not taking: Reported on 07/05/2020 06/30/19   Marcine Matar, MD  ibuprofen (ADVIL) 800 MG tablet Take 1 tablet (800 mg total) by mouth 3 (three) times daily. 07/05/20   Wieters, Hallie C, PA-C  mupirocin ointment (BACTROBAN) 2 % Apply 1 application topically 2 (two) times daily. 05/04/20   Cathie Hoops, Amy V, PA-C  naproxen (NAPROSYN) 500 MG tablet Take 1 tablet (500 mg total) by mouth 2 (two) times daily with a meal. 07/28/20   Tessah Patchen, MD  ondansetron (ZOFRAN ODT) 4 MG disintegrating tablet Take 1 tablet (4 mg total) by mouth every 8 (eight) hours as needed for nausea or vomiting. 07/05/20   Wieters, Hallie C, PA-C  penicillin v potassium (VEETID) 500 MG tablet Take 1 tablet (500 mg total) by mouth 4 (four) times daily. 07/28/20   Alese Furniss, MD  sertraline (ZOLOFT) 50 MG tablet Take 1 tablet (50 mg total) by mouth daily. 1/2 tab PO daily x 2 wks then 1 tab PO daily. 07/21/19   Storm Frisk, MD  amLODipine (NORVASC) 10 MG tablet Take 1 tablet (10 mg total) by mouth  daily. 07/21/19 05/04/20  Storm Frisk, MD    Allergies    Patient has no known allergies.  Review of Systems   Review of Systems  Constitutional: Negative for fever.  HENT: Positive for dental problem. Negative for facial swelling.   Eyes: Negative for visual disturbance.  Respiratory: Negative for shortness of breath.   Cardiovascular: Negative for chest pain.  Gastrointestinal: Negative for abdominal pain.  Genitourinary: Negative for difficulty urinating.  Musculoskeletal: Negative for arthralgias.  Skin: Negative for rash.  Neurological: Negative for dizziness.  Psychiatric/Behavioral: Negative for agitation.  All other systems reviewed and are negative.   Physical Exam Updated Vital Signs There were no vitals taken for this  visit.  Physical Exam Vitals and nursing note reviewed.  Constitutional:      General: He is not in acute distress.    Appearance: Normal appearance.  HENT:     Head: Normocephalic and atraumatic.     Nose: Nose normal.  Eyes:     Conjunctiva/sclera: Conjunctivae normal.     Pupils: Pupils are equal, round, and reactive to light.  Cardiovascular:     Rate and Rhythm: Normal rate and regular rhythm.     Pulses: Normal pulses.     Heart sounds: Normal heart sounds.  Pulmonary:     Effort: Pulmonary effort is normal.     Breath sounds: Normal breath sounds.  Abdominal:     General: Abdomen is flat. Bowel sounds are normal.     Palpations: Abdomen is soft.     Tenderness: There is no abdominal tenderness. There is no guarding or rebound.  Musculoskeletal:        General: Normal range of motion.     Cervical back: Normal range of motion and neck supple.  Skin:    General: Skin is warm and dry.     Capillary Refill: Capillary refill takes less than 2 seconds.  Neurological:     General: No focal deficit present.     Mental Status: He is alert and oriented to person, place, and time.  Psychiatric:        Mood and Affect: Mood normal.        Behavior: Behavior normal.     ED Results / Procedures / Treatments   Labs (all labs ordered are listed, but only abnormal results are displayed) Labs Reviewed - No data to display  EKG None  Radiology No results found.  Procedures Procedures (including critical care time)  Medications Ordered in ED Medications - No data to display  ED Course  I have reviewed the triage vital signs and the nursing notes.  Pertinent labs & imaging results that were available during my care of the patient were reviewed by me and considered in my medical decision making (see chart for details).    Mario Luna was evaluated in Emergency Department on 07/28/2020 for the symptoms described in the history of present illness. He was evaluated in the  context of the global COVID-19 pandemic, which necessitated consideration that the patient might be at risk for infection with the SARS-CoV-2 virus that causes COVID-19. Institutional protocols and algorithms that pertain to the evaluation of patients at risk for COVID-19 are in a state of rapid change based on information released by regulatory bodies including the CDC and federal and state organizations. These policies and algorithms were followed during the patient's care in the ED.  Final Clinical Impression(s) / ED Diagnoses Final diagnoses:  Pain, dental  Return for intractable cough,  coughing up blood,fevers >100.4 unrelieved by medication, shortness of breath, intractable vomiting, chest pain, shortness of breath, weakness,numbness, changes in speech, facial asymmetry,abdominal pain, passing out,Inability to tolerate liquids or food, cough, altered mental status or any concerns. No signs of systemic illness or infection. The patient is nontoxic-appearing on exam and vital signs are within normal limits.   I have reviewed the triage vital signs and the nursing notes. Pertinent labs &imaging results that were available during my care of the patient were reviewed by me and considered in my medical decision making (see chart for details).After history, exam, and medical workup I feel the patient has beenappropriately medically screened and is safe for discharge home. Pertinent diagnoses were discussed with the patient. Patient was given return precautions.     Rx / DC Orders ED Discharge Orders         Ordered    penicillin v potassium (VEETID) 500 MG tablet  4 times daily        07/28/20 0120    naproxen (NAPROSYN) 500 MG tablet  2 times daily with meals        07/28/20 0120           Marella Vanderpol, MD 07/28/20 [id number]

## 2020-07-28 NOTE — ED Triage Notes (Signed)
Pt c/o toothache

## 2021-05-04 ENCOUNTER — Other Ambulatory Visit: Payer: Self-pay

## 2021-05-04 ENCOUNTER — Emergency Department (HOSPITAL_BASED_OUTPATIENT_CLINIC_OR_DEPARTMENT_OTHER)
Admission: EM | Admit: 2021-05-04 | Discharge: 2021-05-04 | Disposition: A | Discharge status: Home / Self Care | Payer: No Typology Code available for payment source | Attending: Emergency Medicine | Admitting: Emergency Medicine

## 2021-05-04 ENCOUNTER — Emergency Department (HOSPITAL_BASED_OUTPATIENT_CLINIC_OR_DEPARTMENT_OTHER): Payer: No Typology Code available for payment source

## 2021-05-04 ENCOUNTER — Encounter (HOSPITAL_BASED_OUTPATIENT_CLINIC_OR_DEPARTMENT_OTHER): Payer: Self-pay | Admitting: Emergency Medicine

## 2021-05-04 DIAGNOSIS — F17210 Nicotine dependence, cigarettes, uncomplicated: Secondary | ICD-10-CM | POA: Insufficient documentation

## 2021-05-04 DIAGNOSIS — S39012A Strain of muscle, fascia and tendon of lower back, initial encounter: Secondary | ICD-10-CM | POA: Diagnosis not present

## 2021-05-04 DIAGNOSIS — Y92410 Unspecified street and highway as the place of occurrence of the external cause: Secondary | ICD-10-CM | POA: Diagnosis not present

## 2021-05-04 DIAGNOSIS — R519 Headache, unspecified: Secondary | ICD-10-CM | POA: Diagnosis not present

## 2021-05-04 DIAGNOSIS — S161XXA Strain of muscle, fascia and tendon at neck level, initial encounter: Secondary | ICD-10-CM

## 2021-05-04 DIAGNOSIS — S3992XA Unspecified injury of lower back, initial encounter: Secondary | ICD-10-CM | POA: Diagnosis present

## 2021-05-04 MED ORDER — NAPROXEN 250 MG PO TABS
500.0000 mg | ORAL_TABLET | Freq: Once | ORAL | Status: AC
  Administered 2021-05-04: 500 mg via ORAL
  Filled 2021-05-04: qty 2

## 2021-05-04 MED ORDER — ACETAMINOPHEN 325 MG PO TABS
650.0000 mg | ORAL_TABLET | Freq: Once | ORAL | Status: AC
  Administered 2021-05-04: 650 mg via ORAL
  Filled 2021-05-04: qty 2

## 2021-05-04 MED ORDER — CYCLOBENZAPRINE HCL 10 MG PO TABS: 10.0000 mg | ORAL_TABLET | Freq: Two times a day (BID) | ORAL | 0 refills | Status: DC | PRN

## 2021-05-04 MED ORDER — NAPROXEN 500 MG PO TABS: 500.0000 mg | ORAL_TABLET | Freq: Two times a day (BID) | ORAL | 0 refills | Status: DC

## 2021-05-04 MED ORDER — CYCLOBENZAPRINE HCL 10 MG PO TABS
10.0000 mg | ORAL_TABLET | Freq: Once | ORAL | Status: AC
  Administered 2021-05-04: 10 mg via ORAL
  Filled 2021-05-04: qty 1
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
## 2021-05-17 ENCOUNTER — Ambulatory Visit
Admission: EM | Admit: 2021-05-17 | Discharge: 2021-05-17 | Disposition: A | Discharge status: Home / Self Care | Payer: Self-pay | Attending: Urgent Care | Admitting: Urgent Care

## 2021-05-17 ENCOUNTER — Encounter: Payer: Self-pay | Admitting: Emergency Medicine

## 2021-05-17 DIAGNOSIS — R519 Headache, unspecified: Secondary | ICD-10-CM

## 2021-05-17 DIAGNOSIS — M62830 Muscle spasm of back: Secondary | ICD-10-CM

## 2021-05-17 DIAGNOSIS — M79675 Pain in left toe(s): Secondary | ICD-10-CM

## 2021-05-17 DIAGNOSIS — J3089 Other allergic rhinitis: Secondary | ICD-10-CM

## 2021-05-17 MED ORDER — LEVOCETIRIZINE DIHYDROCHLORIDE 5 MG PO TABS: 5.0000 mg | ORAL_TABLET | Freq: Every evening | ORAL | 0 refills | Status: DC

## 2021-05-17 MED ORDER — FLUTICASONE PROPIONATE 50 MCG/ACT NA SUSP: 2.0000 | Freq: Every day | NASAL | 0 refills | Status: DC

## 2021-05-17 MED ORDER — NAPROXEN 500 MG PO TABS: 500.0000 mg | ORAL_TABLET | Freq: Two times a day (BID) | ORAL | 0 refills | Status: DC

## 2021-05-17 MED ORDER — TIZANIDINE HCL 4 MG PO TABS: 4.0000 mg | ORAL_TABLET | Freq: Three times a day (TID) | ORAL | 0 refills | Status: DC | PRN
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
## 2021-06-06 ENCOUNTER — Ambulatory Visit: Payer: Self-pay | Admitting: Family

## 2021-06-09 ENCOUNTER — Ambulatory Visit: Payer: Self-pay | Admitting: Family

## 2021-07-06 ENCOUNTER — Emergency Department (HOSPITAL_BASED_OUTPATIENT_CLINIC_OR_DEPARTMENT_OTHER): Payer: Self-pay

## 2021-07-06 ENCOUNTER — Emergency Department (HOSPITAL_BASED_OUTPATIENT_CLINIC_OR_DEPARTMENT_OTHER)
Admission: EM | Admit: 2021-07-06 | Discharge: 2021-07-06 | Disposition: A | Discharge status: Home / Self Care | Payer: Self-pay | Attending: Emergency Medicine | Admitting: Emergency Medicine

## 2021-07-06 ENCOUNTER — Encounter (HOSPITAL_BASED_OUTPATIENT_CLINIC_OR_DEPARTMENT_OTHER): Payer: Self-pay | Admitting: *Deleted

## 2021-07-06 ENCOUNTER — Other Ambulatory Visit: Payer: Self-pay

## 2021-07-06 DIAGNOSIS — Z79899 Other long term (current) drug therapy: Secondary | ICD-10-CM | POA: Insufficient documentation

## 2021-07-06 DIAGNOSIS — I10 Essential (primary) hypertension: Secondary | ICD-10-CM | POA: Insufficient documentation

## 2021-07-06 DIAGNOSIS — J020 Streptococcal pharyngitis: Secondary | ICD-10-CM | POA: Insufficient documentation

## 2021-07-06 DIAGNOSIS — F17210 Nicotine dependence, cigarettes, uncomplicated: Secondary | ICD-10-CM | POA: Insufficient documentation

## 2021-07-06 DIAGNOSIS — Z20822 Contact with and (suspected) exposure to covid-19: Secondary | ICD-10-CM | POA: Insufficient documentation

## 2021-07-06 LAB — SARS CORONAVIRUS 2 (TAT 6-24 HRS): SARS Coronavirus 2: NEGATIVE

## 2021-07-06 LAB — GROUP A STREP BY PCR: Group A Strep by PCR: DETECTED — AB

## 2021-07-06 MED ORDER — DEXAMETHASONE 6 MG PO TABS: 10.0000 mg | ORAL_TABLET | Freq: Once | ORAL | Status: DC

## 2021-07-06 MED ORDER — LIDOCAINE VISCOUS HCL 2 % MT SOLN
15.0000 mL | Freq: Once | OROMUCOSAL | Status: AC
  Administered 2021-07-06: 15 mL via OROMUCOSAL
  Filled 2021-07-06: qty 15

## 2021-07-06 MED ORDER — LIDOCAINE VISCOUS HCL 2 % MT SOLN: 15.0000 mL | OROMUCOSAL | 0 refills | Status: DC | PRN

## 2021-07-06 MED ORDER — AMOXICILLIN-POT CLAVULANATE 875-125 MG PO TABS
1.0000 | ORAL_TABLET | Freq: Once | ORAL | Status: AC
  Administered 2021-07-06: 1 via ORAL
  Filled 2021-07-06: qty 1

## 2021-07-06 MED ORDER — DEXAMETHASONE 6 MG PO TABS
12.0000 mg | ORAL_TABLET | Freq: Once | ORAL | Status: AC
  Administered 2021-07-06: 12 mg via ORAL

## 2021-07-06 MED ORDER — KETOROLAC TROMETHAMINE 60 MG/2ML IM SOLN
60.0000 mg | Freq: Once | INTRAMUSCULAR | Status: AC
  Administered 2021-07-06: 60 mg via INTRAMUSCULAR
  Filled 2021-07-06: qty 2

## 2021-07-06 MED ORDER — AMOXICILLIN-POT CLAVULANATE 875-125 MG PO TABS: 1.0000 | ORAL_TABLET | Freq: Two times a day (BID) | ORAL | 0 refills | Status: AC

## 2021-07-06 NOTE — ED Notes (Signed)
Pt A&OX4, ambulatory at d/c with independent steady gait  

## 2021-07-06 NOTE — ED Provider Notes (Signed)
[facility] EMERGENCY DEPARTMENT Provider Note   CSN: [id number] Arrival date & time: 07/06/21  0143     History Chief Complaint  Patient presents with   Sore Throat    Mario Luna is a 30 y.o. male.   Sore Throat This is a new problem. The current episode started more than 2 days ago. The problem occurs constantly. The problem has been gradually worsening. Pertinent negatives include no chest pain, no abdominal pain, no headaches and no shortness of breath. Nothing aggravates the symptoms. Nothing relieves the symptoms. He has tried nothing for the symptoms.      Past Medical History:  Diagnosis Date   A-fib [facility])    Anxiety    Atrial fibrillation (HCC) 05/19/2015   Heart murmur    History of palpitations     Patient Active Problem List   Diagnosis Date Noted   Essential hypertension 07/21/2019   Onychomycosis of toenail 07/21/2019   Tinea pedis of both feet 07/21/2019   Tobacco use disorder 07/21/2019   Panic disorder 06/16/2019   Insomnia due to other mental disorder 06/16/2019    Past Surgical History:  Procedure Laterality Date   ADENOIDECTOMY     TONSILLECTOMY         Family History  Adopted: Yes  Problem Relation Age of Onset   Healthy Father    Healthy Mother     Social History   Tobacco Use   Smoking status: Some Days    Packs/day: 2.00    Years: 5.00    Pack years: 10.00    Types: Cigars, Cigarettes   Smokeless tobacco: Never  Vaping Use   Vaping Use: Never used  Substance Use Topics   Alcohol use: Yes   Drug use: No    Home Medications Prior to Admission medications   Medication Sig Start Date End Date Taking? Authorizing Provider  amoxicillin-clavulanate (AUGMENTIN) 875-125 MG tablet Take 1 tablet by mouth 2 (two) times daily for 7 days. One po bid x 7 days 07/06/21 07/13/21 Yes Monic Engelmann, Barbara Cower, MD  lidocaine (XYLOCAINE) 2 % solution Use as directed 15 mLs in the mouth or throat as needed for mouth pain. 07/06/21  Yes Zakarie Sturdivant,  Barbara Cower, MD  chlorhexidine (PERIDEX) 0.12 % solution Use as directed 15 mLs in the mouth or throat 2 (two) times daily. 07/28/20   Palumbo, April, MD  cyclobenzaprine (FLEXERIL) 10 MG tablet Take 1 tablet (10 mg total) by mouth 2 (two) times daily as needed for muscle spasms. 05/04/21   Pollyann Savoy, MD  fluticasone (FLONASE) 50 MCG/ACT nasal spray Place 2 sprays into both nostrils daily. 05/17/21   Wallis Bamberg, PA-C  levocetirizine (XYZAL) 5 MG tablet Take 1 tablet (5 mg total) by mouth every evening. 05/17/21   Wallis Bamberg, PA-C  mupirocin ointment (BACTROBAN) 2 % Apply 1 application topically 2 (two) times daily. 05/04/20   Cathie Hoops, Amy V, PA-C  naproxen (NAPROSYN) 500 MG tablet Take 1 tablet (500 mg total) by mouth 2 (two) times daily with a meal. 05/17/21   Wallis Bamberg, PA-C  ondansetron (ZOFRAN ODT) 4 MG disintegrating tablet Take 1 tablet (4 mg total) by mouth every 8 (eight) hours as needed for nausea or vomiting. 07/05/20   Wieters, Hallie C, PA-C  sertraline (ZOLOFT) 50 MG tablet Take 1 tablet (50 mg total) by mouth daily. 1/2 tab PO daily x 2 wks then 1 tab PO daily. 07/21/19   Storm Frisk, MD  tiZANidine (ZANAFLEX) 4 MG tablet Take  1 tablet (4 mg total) by mouth every 8 (eight) hours as needed for muscle spasms. 05/17/21   Wallis Bamberg, PA-C  amLODipine (NORVASC) 10 MG tablet Take 1 tablet (10 mg total) by mouth daily. 07/21/19 05/04/20  Storm Frisk, MD    Allergies    Patient has no known allergies.  Review of Systems   Review of Systems  Respiratory:  Negative for shortness of breath.   Cardiovascular:  Negative for chest pain.  Gastrointestinal:  Negative for abdominal pain.  Neurological:  Negative for headaches.  All other systems reviewed and are negative.  Physical Exam Updated Vital Signs BP 111/70 (BP Location: Right Arm)   Pulse 77   Temp 98.2 F (36.8 C) (Oral)   Resp 16   Ht 5\' 5"  (1.651 m)   Wt 86.2 kg   SpO2 100%   BMI 31.62 kg/m   Physical Exam Vitals  and nursing note reviewed.  Constitutional:      Appearance: He is well-developed.  HENT:     Head: Normocephalic and atraumatic.     Mouth/Throat:     Mouth: Mucous membranes are moist.     Pharynx: Posterior oropharyngeal erythema (mild) present.  Eyes:     Extraocular Movements:     Right eye: Normal extraocular motion.     Left eye: Normal extraocular motion.     Conjunctiva/sclera: Conjunctivae normal.  Cardiovascular:     Rate and Rhythm: Normal rate.  Pulmonary:     Effort: Pulmonary effort is normal. No respiratory distress.  Abdominal:     General: There is no distension.     Palpations: Abdomen is soft.  Musculoskeletal:        General: Normal range of motion.     Cervical back: Normal range of motion.  Skin:    General: Skin is warm and dry.  Neurological:     Mental Status: He is alert.    ED Results / Procedures / Treatments   Labs (all labs ordered are listed, but only abnormal results are displayed) Labs Reviewed  GROUP A STREP BY PCR - Abnormal; Notable for the following components:      Result Value   Group A Strep by PCR DETECTED (*)    All other components within normal limits  SARS CORONAVIRUS 2 (TAT 6-24 HRS)    EKG None  Radiology DG Neck Soft Tissue  Result Date: 07/06/2021 CLINICAL DATA:  Right throat pain EXAM: NECK SOFT TISSUES - 1+ VIEW COMPARISON:  None. FINDINGS: There is no evidence of retropharyngeal soft tissue swelling or epiglottic enlargement. The cervical airway is unremarkable and no radio-opaque foreign body identified. IMPRESSION: Negative. Electronically Signed   By: 09/05/2021 M.D.   On: 07/06/2021 02:46    Procedures Procedures   Medications Ordered in ED Medications  lidocaine (XYLOCAINE) 2 % viscous mouth solution 15 mL (15 mLs Mouth/Throat Given 07/06/21 0238)  ketorolac (TORADOL) injection 60 mg (60 mg Intramuscular Given 07/06/21 0238)  dexamethasone (DECADRON) tablet 12 mg (12 mg Oral Given 07/06/21 0311)   amoxicillin-clavulanate (AUGMENTIN) 875-125 MG per tablet 1 tablet (1 tablet Oral Given 07/06/21 0358)    ED Course  I have reviewed the triage vital signs and the nursing notes.  Pertinent labs & imaging results that were available during my care of the patient were reviewed by me and considered in my medical decision making (see chart for details).    MDM Rules/Calculators/A&P  Strep. Symptomatic treatmetn provided. No e/o RPA or pTA.    Final Clinical Impression(s) / ED Diagnoses Final diagnoses:  Strep throat    Rx / DC Orders ED Discharge Orders          Ordered    amoxicillin-clavulanate (AUGMENTIN) 875-125 MG tablet  2 times daily        07/06/21 0351    lidocaine (XYLOCAINE) 2 % solution  As needed        07/06/21 0351             Adeoluwa Silvers, Barbara Cower, MD 07/06/21 0510

## 2021-07-06 NOTE — ED Triage Notes (Addendum)
right sided throat pain x several days.  Increased pain with swallowing. Denies fever.  No difficulty controlling secretions in triage.

## 2021-07-24 ENCOUNTER — Ambulatory Visit (INDEPENDENT_AMBULATORY_CARE_PROVIDER_SITE_OTHER): Payer: Self-pay | Admitting: Family Medicine

## 2021-07-24 ENCOUNTER — Other Ambulatory Visit: Payer: Self-pay

## 2021-07-24 ENCOUNTER — Encounter: Payer: Self-pay | Admitting: Family Medicine

## 2021-07-24 VITALS — BP 132/76 | HR 90 | Temp 98.5°F | Resp 16 | Ht 65.0 in | Wt 177.0 lb

## 2021-07-24 DIAGNOSIS — B349 Viral infection, unspecified: Secondary | ICD-10-CM

## 2021-07-24 DIAGNOSIS — M542 Cervicalgia: Secondary | ICD-10-CM

## 2021-07-24 DIAGNOSIS — M545 Low back pain, unspecified: Secondary | ICD-10-CM

## 2021-07-24 LAB — POCT INFLUENZA A/B
Influenza A, POC: NEGATIVE
Influenza B, POC: NEGATIVE

## 2021-07-24 NOTE — Progress Notes (Signed)
Patient is here for possible COVID. Patient has been having headache, shivers, throat itching x 3 days  Patient request referral for PT per laywer

## 2021-07-25 ENCOUNTER — Other Ambulatory Visit: Payer: Self-pay

## 2021-07-25 ENCOUNTER — Emergency Department (HOSPITAL_BASED_OUTPATIENT_CLINIC_OR_DEPARTMENT_OTHER)
Admission: EM | Admit: 2021-07-25 | Discharge: 2021-07-25 | Disposition: A | Discharge status: Home / Self Care | Payer: Self-pay | Attending: Emergency Medicine | Admitting: Emergency Medicine

## 2021-07-25 ENCOUNTER — Encounter (HOSPITAL_BASED_OUTPATIENT_CLINIC_OR_DEPARTMENT_OTHER): Payer: Self-pay | Admitting: *Deleted

## 2021-07-25 ENCOUNTER — Emergency Department (HOSPITAL_BASED_OUTPATIENT_CLINIC_OR_DEPARTMENT_OTHER): Payer: Self-pay

## 2021-07-25 DIAGNOSIS — F17210 Nicotine dependence, cigarettes, uncomplicated: Secondary | ICD-10-CM | POA: Insufficient documentation

## 2021-07-25 DIAGNOSIS — U071 COVID-19: Secondary | ICD-10-CM | POA: Insufficient documentation

## 2021-07-25 DIAGNOSIS — I10 Essential (primary) hypertension: Secondary | ICD-10-CM | POA: Insufficient documentation

## 2021-07-25 DIAGNOSIS — F17290 Nicotine dependence, other tobacco product, uncomplicated: Secondary | ICD-10-CM | POA: Insufficient documentation

## 2021-07-25 DIAGNOSIS — J3489 Other specified disorders of nose and nasal sinuses: Secondary | ICD-10-CM | POA: Insufficient documentation

## 2021-07-25 DIAGNOSIS — Z20822 Contact with and (suspected) exposure to covid-19: Secondary | ICD-10-CM

## 2021-07-25 LAB — SARS CORONAVIRUS 2 (TAT 6-24 HRS): SARS Coronavirus 2: POSITIVE — AB

## 2021-07-25 MED ORDER — ACETAMINOPHEN 500 MG PO TABS
1000.0000 mg | ORAL_TABLET | Freq: Once | ORAL | Status: AC
  Administered 2021-07-25: 1000 mg via ORAL
  Filled 2021-07-25: qty 2

## 2021-07-25 NOTE — ED Notes (Signed)
Pt A&ox4 ambulatory at d/c with independent steady gait

## 2021-07-25 NOTE — ED Provider Notes (Signed)
[facility] EMERGENCY DEPARTMENT Provider Note   CSN: [id number] Arrival date & time: 07/25/21  0130     History Chief Complaint  Patient presents with  . URI    Mario Luna is a 30 y.o. male.  The history is provided by the patient.  URI Presenting symptoms: congestion, cough, rhinorrhea and sore throat   Presenting symptoms: no fever   Severity:  Moderate Onset quality:  Gradual Duration:  2 days Timing:  Constant Progression:  Unchanged Chronicity:  New Relieved by:  Nothing Worsened by:  Nothing Ineffective treatments:  None tried Associated symptoms: myalgias   Associated symptoms: no arthralgias   Risk factors: not elderly   Not vaccinated for covid 19 with symptoms of same.      Past Medical History:  Diagnosis Date  . A-fib (HCC)   . Anxiety   . Atrial fibrillation (HCC) 05/19/2015  . Heart murmur   . History of palpitations     Patient Active Problem List   Diagnosis Date Noted  . Essential hypertension 07/21/2019  . Onychomycosis of toenail 07/21/2019  . Tinea pedis of both feet 07/21/2019  . Tobacco use disorder 07/21/2019  . Panic disorder 06/16/2019  . Insomnia due to other mental disorder 06/16/2019    Past Surgical History:  Procedure Laterality Date  . ADENOIDECTOMY    . TONSILLECTOMY         Family History  Adopted: Yes  Problem Relation Age of Onset  . Healthy Father   . Healthy Mother     Social History   Tobacco Use  . Smoking status: Some Days    Packs/day: 2.00    Years: 5.00    Pack years: 10.00    Types: Cigars, Cigarettes  . Smokeless tobacco: Never  Vaping Use  . Vaping Use: Never used  Substance Use Topics  . Alcohol use: Yes  . Drug use: No    Home Medications Prior to Admission medications   Medication Sig Start Date End Date Taking? Authorizing Provider  chlorhexidine (PERIDEX) 0.12 % solution Use as directed 15 mLs in the mouth or throat 2 (two) times daily. Patient not taking: Reported  on 07/24/2021 07/28/20   Lilliona Blakeney, MD  cyclobenzaprine (FLEXERIL) 10 MG tablet Take 1 tablet (10 mg total) by mouth 2 (two) times daily as needed for muscle spasms. Patient not taking: Reported on 07/24/2021 05/04/21   Pollyann Savoy, MD  fluticasone [facility]) 50 MCG/ACT nasal spray Place 2 sprays into both nostrils daily. Patient not taking: Reported on 07/24/2021 05/17/21   Wallis Bamberg, PA-C  levocetirizine (XYZAL) 5 MG tablet Take 1 tablet (5 mg total) by mouth every evening. Patient not taking: Reported on 07/24/2021 05/17/21   Wallis Bamberg, PA-C  lidocaine (XYLOCAINE) 2 % solution Use as directed 15 mLs in the mouth or throat as needed for mouth pain. Patient not taking: Reported on 07/24/2021 07/06/21   Mesner, Barbara Cower, MD  mupirocin ointment (BACTROBAN) 2 % Apply 1 application topically 2 (two) times daily. Patient not taking: Reported on 07/24/2021 05/04/20   Belinda Fisher, PA-C  naproxen (NAPROSYN) 500 MG tablet Take 1 tablet (500 mg total) by mouth 2 (two) times daily with a meal. Patient not taking: Reported on 07/24/2021 05/17/21   Wallis Bamberg, PA-C  ondansetron (ZOFRAN ODT) 4 MG disintegrating tablet Take 1 tablet (4 mg total) by mouth every 8 (eight) hours as needed for nausea or vomiting. Patient not taking: Reported on 07/24/2021 07/05/20   Patterson Hammersmith  C, PA-C  sertraline (ZOLOFT) 50 MG tablet Take 1 tablet (50 mg total) by mouth daily. 1/2 tab PO daily x 2 wks then 1 tab PO daily. Patient not taking: Reported on 07/24/2021 07/21/19   Storm Frisk, MD  tiZANidine (ZANAFLEX) 4 MG tablet Take 1 tablet (4 mg total) by mouth every 8 (eight) hours as needed for muscle spasms. Patient not taking: Reported on 07/24/2021 05/17/21   Wallis Bamberg, PA-C  amLODipine (NORVASC) 10 MG tablet Take 1 tablet (10 mg total) by mouth daily. 07/21/19 05/04/20  Storm Frisk, MD    Allergies    Patient has no known allergies.  Review of Systems   Review of Systems  Constitutional:  Negative for fever.   HENT:  Positive for congestion, rhinorrhea and sore throat.   Eyes:  Negative for visual disturbance.  Respiratory:  Positive for cough. Negative for shortness of breath.   Cardiovascular:  Negative for palpitations and leg swelling.  Gastrointestinal:  Negative for vomiting.  Genitourinary:  Negative for difficulty urinating.  Musculoskeletal:  Positive for myalgias. Negative for arthralgias.  Skin:  Negative for rash.  Neurological:  Negative for dizziness.  Psychiatric/Behavioral:  Negative for agitation.   All other systems reviewed and are negative.  Physical Exam Updated Vital Signs BP 128/77 (BP Location: Left Arm)   Pulse 89   Temp 98.7 F (37.1 C) (Oral)   Resp 18   Ht 5\' 5"  (1.651 m)   Wt 80.3 kg   SpO2 100%   BMI 29.45 kg/m   Physical Exam Vitals and nursing note reviewed.  Constitutional:      General: He is not in acute distress.    Appearance: Normal appearance.  HENT:     Head: Normocephalic and atraumatic.     Nose: Nose normal.  Eyes:     Conjunctiva/sclera: Conjunctivae normal.     Pupils: Pupils are equal, round, and reactive to light.  Cardiovascular:     Rate and Rhythm: Normal rate and regular rhythm.     Pulses: Normal pulses.     Heart sounds: Normal heart sounds.  Pulmonary:     Effort: Pulmonary effort is normal.     Breath sounds: Normal breath sounds.  Abdominal:     General: Abdomen is flat. Bowel sounds are normal.     Palpations: Abdomen is soft.     Tenderness: There is no abdominal tenderness. There is no guarding.  Musculoskeletal:     Cervical back: Normal range of motion and neck supple.  Skin:    General: Skin is warm and dry.     Capillary Refill: Capillary refill takes less than 2 seconds.  Neurological:     General: No focal deficit present.     Mental Status: He is alert and oriented to person, place, and time.     Deep Tendon Reflexes: Reflexes normal.  Psychiatric:        Mood and Affect: Mood normal.         Behavior: Behavior normal.    ED Results / Procedures / Treatments   Labs (all labs ordered are listed, but only abnormal results are displayed) Labs Reviewed  SARS CORONAVIRUS 2 (TAT 6-24 HRS)    EKG None   EKG Interpretation  Date/Time:  Tuesday July 25 2021 01:52:29 EDT Ventricular Rate:  83 PR Interval:  160 QRS Duration: 90 QT Interval:  340 QTC Calculation: 399 R Axis:   101 Text Interpretation: Normal sinus rhythm Confirmed by [id number], Augie Vane (  [id number]) on 07/25/2021 3:58:00 AM        Radiology DG Chest 1 View  Result Date: 07/25/2021 CLINICAL DATA:  Chest pain, chills, cough, and congestion. EXAM: CHEST  1 VIEW COMPARISON:  None. FINDINGS: The heart size and mediastinal contours are within normal limits. Both lungs are clear. The visualized skeletal structures are unremarkable. IMPRESSION: No active disease. Electronically Signed   By: Burman Nieves M.D.   On: 07/25/2021 02:35    Procedures Procedures   Medications Ordered in ED Medications - No data to display  ED Course  I have reviewed the triage vital signs and the nursing notes.  Pertinent labs & imaging results that were available during my care of the patient were reviewed by me and considered in my medical decision making (see chart for details).   Alternate tylenol and ibuprofen.  Test will result in 24 hours.    Mario Luna was evaluated in Emergency Department on 07/25/2021 for the symptoms described in the history of present illness. He was evaluated in the context of the global COVID-19 pandemic, which necessitated consideration that the patient might be at risk for infection with the SARS-CoV-2 virus that causes COVID-19. Institutional protocols and algorithms that pertain to the evaluation of patients at risk for COVID-19 are in a state of rapid change based on information released by regulatory bodies including the CDC and federal and state organizations. These policies and algorithms were  followed during the patient's care in the ED.  Final Clinical Impression(s) / ED Diagnoses Final diagnoses:  None   Return for intractable cough, coughing up blood, fevers > 100.4 unrelieved by medication, shortness of breath, intractable vomiting, chest pain, shortness of breath, weakness, numbness, changes in speech, facial asymmetry, abdominal pain, passing out, Inability to tolerate liquids or food, cough, altered mental status or any concerns. No signs of systemic illness or infection. The patient is nontoxic-appearing on exam and vital signs are within normal limits. I have reviewed the triage vital signs and the nursing notes. Pertinent labs & imaging results that were available during my care of the patient were reviewed by me and considered in my medical decision making (see chart for details). After history, exam, and medical workup I feel the patient has been appropriately medically screened and is safe for discharge home. Pertinent diagnoses were discussed with the patient. Patient was given return precautions. Rx / DC Orders ED Discharge Orders     None        Zuriyah Shatz, MD 07/25/21 [phone]
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
## 2021-07-25 NOTE — ED Triage Notes (Signed)
Chills, chest pain with cough, congestions x several days.  Had covid test at PCP today-pending results.

## 2021-08-08 ENCOUNTER — Emergency Department (HOSPITAL_BASED_OUTPATIENT_CLINIC_OR_DEPARTMENT_OTHER): Payer: Self-pay

## 2021-08-08 ENCOUNTER — Emergency Department (HOSPITAL_BASED_OUTPATIENT_CLINIC_OR_DEPARTMENT_OTHER)
Admission: EM | Admit: 2021-08-08 | Discharge: 2021-08-08 | Disposition: A | Discharge status: Home / Self Care | Payer: Self-pay | Attending: Emergency Medicine | Admitting: Emergency Medicine

## 2021-08-08 ENCOUNTER — Encounter (HOSPITAL_BASED_OUTPATIENT_CLINIC_OR_DEPARTMENT_OTHER): Payer: Self-pay

## 2021-08-08 ENCOUNTER — Other Ambulatory Visit: Payer: Self-pay

## 2021-08-08 DIAGNOSIS — F419 Anxiety disorder, unspecified: Secondary | ICD-10-CM | POA: Insufficient documentation

## 2021-08-08 DIAGNOSIS — R0602 Shortness of breath: Secondary | ICD-10-CM | POA: Insufficient documentation

## 2021-08-08 DIAGNOSIS — Z79899 Other long term (current) drug therapy: Secondary | ICD-10-CM | POA: Insufficient documentation

## 2021-08-08 DIAGNOSIS — I10 Essential (primary) hypertension: Secondary | ICD-10-CM | POA: Insufficient documentation

## 2021-08-08 DIAGNOSIS — R110 Nausea: Secondary | ICD-10-CM | POA: Insufficient documentation

## 2021-08-08 DIAGNOSIS — F17210 Nicotine dependence, cigarettes, uncomplicated: Secondary | ICD-10-CM | POA: Insufficient documentation

## 2021-08-08 DIAGNOSIS — R000 Tachycardia, unspecified: Secondary | ICD-10-CM | POA: Insufficient documentation

## 2021-08-08 LAB — BASIC METABOLIC PANEL
Anion gap: 9 (ref 5–15)
BUN: 12 mg/dL (ref 6–20)
CO2: 23 mmol/L (ref 22–32)
Calcium: 9.6 mg/dL (ref 8.9–10.3)
Chloride: 103 mmol/L (ref 98–111)
Creatinine, Ser: 1.19 mg/dL (ref 0.61–1.24)
GFR, Estimated: >60 mL/min (ref >60)
Glucose, Bld: 98 mg/dL (ref 70–99)
Potassium: 3.4 mmol/L — ABNORMAL LOW (ref 3.5–5.1)
Sodium: 135 mmol/L (ref 135–145)

## 2021-08-08 LAB — TROPONIN I (HIGH SENSITIVITY)
Troponin I (High Sensitivity): 3 ng/L (ref <18)
Troponin I (High Sensitivity): 4 ng/L (ref <18)

## 2021-08-08 LAB — CBC
HCT: 49.5 % (ref 39.0–52.0)
Hemoglobin: 17.8 g/dL — ABNORMAL HIGH (ref 13.0–17.0)
MCH: 31.6 pg (ref 26.0–34.0)
MCHC: 36 g/dL (ref 30.0–36.0)
MCV: 87.8 fL (ref 80.0–100.0)
Platelets: 287 10*3/uL (ref 150–400)
RBC: 5.64 MIL/uL (ref 4.22–5.81)
RDW: 13.4 % (ref 11.5–15.5)
WBC: 8.1 10*3/uL (ref 4.0–10.5)
nRBC: 0 % (ref 0.0–0.2)

## 2021-08-08 MED ORDER — ONDANSETRON 4 MG PO TBDP: 4.0000 mg | ORAL_TABLET | Freq: Three times a day (TID) | ORAL | 0 refills | Status: DC | PRN

## 2021-08-08 NOTE — Discharge Instructions (Addendum)
As we discussed you can use the Zofran as needed for nausea.  I encourage drinking lots of fluids, getting a lot of rest.  I recommend that you follow-up with your primary care doctor about your anxiety, and consider trying a new medication to help with anxiety and panic attacks.  It was a pleasure taking care of you today. I hope that you feel better soon.

## 2021-08-08 NOTE — ED Notes (Signed)
Pt reports he was getting better from his COVID symptoms, but recently he has started feeling worse.

## 2021-08-08 NOTE — ED Provider Notes (Signed)
[facility] EMERGENCY DEPARTMENT Provider Note   CSN: [id number] Arrival date & time: 08/08/21  4098     History Chief Complaint  Patient presents with   Shortness of Breath    Mario Luna is a 30 y.o. male with past medical history significant for anxiety not currently with any treatment or counseling, as well as a history of panic disorder who presents with 2 days of chest pain, chest tightness, shortness of breath that started gradually, is not associated with activity, is not associate with radiation to the arm or to the jaw.  Patient also endorses some nausea, without vomiting, as well as some diarrhea.  Patient was recently diagnosed with COVID on September 20.  Patient did not take any antivirals.  He does endorse some runny nose and cough that are mostly resolved at this time, does endorse some generalized myalgias as well.  Patient is not take anything for his chest pain at this time.  Patient reports that he lost his father around 1 year ago, and that he has had worsening panic attacks since this event.   Shortness of Breath Associated symptoms: chest pain       Past Medical History:  Diagnosis Date   A-fib (HCC)    Anxiety    Atrial fibrillation (HCC) 05/19/2015   Heart murmur    History of palpitations     Patient Active Problem List   Diagnosis Date Noted   Essential hypertension 07/21/2019   Onychomycosis of toenail 07/21/2019   Tinea pedis of both feet 07/21/2019   Tobacco use disorder 07/21/2019   Panic disorder 06/16/2019   Insomnia due to other mental disorder 06/16/2019    Past Surgical History:  Procedure Laterality Date   ADENOIDECTOMY     TONSILLECTOMY         Family History  Adopted: Yes  Problem Relation Age of Onset   Healthy Father    Healthy Mother     Social History   Tobacco Use   Smoking status: Some Days    Packs/day: 2.00    Years: 5.00    Pack years: 10.00    Types: Cigars, Cigarettes   Smokeless tobacco:  Never  Vaping Use   Vaping Use: Never used  Substance Use Topics   Alcohol use: Yes   Drug use: No    Home Medications Prior to Admission medications   Medication Sig Start Date End Date Taking? Authorizing Provider  ondansetron (ZOFRAN ODT) 4 MG disintegrating tablet Take 1 tablet (4 mg total) by mouth every 8 (eight) hours as needed for nausea or vomiting. 08/08/21  Yes Nymir Ringler H, PA-C  chlorhexidine (PERIDEX) 0.12 % solution Use as directed 15 mLs in the mouth or throat 2 (two) times daily. Patient not taking: Reported on 07/24/2021 07/28/20   Palumbo, April, MD  cyclobenzaprine (FLEXERIL) 10 MG tablet Take 1 tablet (10 mg total) by mouth 2 (two) times daily as needed for muscle spasms. Patient not taking: Reported on 07/24/2021 05/04/21   Pollyann Savoy, MD  fluticasone [facility]) 50 MCG/ACT nasal spray Place 2 sprays into both nostrils daily. Patient not taking: Reported on 07/24/2021 05/17/21   Wallis Bamberg, PA-C  levocetirizine (XYZAL) 5 MG tablet Take 1 tablet (5 mg total) by mouth every evening. Patient not taking: Reported on 07/24/2021 05/17/21   Wallis Bamberg, PA-C  lidocaine (XYLOCAINE) 2 % solution Use as directed 15 mLs in the mouth or throat as needed for mouth pain. Patient not taking: Reported on  07/24/2021 07/06/21   Mesner, Barbara Cower, MD  mupirocin ointment (BACTROBAN) 2 % Apply 1 application topically 2 (two) times daily. Patient not taking: Reported on 07/24/2021 05/04/20   Belinda Fisher, PA-C  naproxen (NAPROSYN) 500 MG tablet Take 1 tablet (500 mg total) by mouth 2 (two) times daily with a meal. Patient not taking: Reported on 07/24/2021 05/17/21   Wallis Bamberg, PA-C  sertraline (ZOLOFT) 50 MG tablet Take 1 tablet (50 mg total) by mouth daily. 1/2 tab PO daily x 2 wks then 1 tab PO daily. Patient not taking: Reported on 07/24/2021 07/21/19   Storm Frisk, MD  tiZANidine (ZANAFLEX) 4 MG tablet Take 1 tablet (4 mg total) by mouth every 8 (eight) hours as needed for muscle  spasms. Patient not taking: Reported on 07/24/2021 05/17/21   Wallis Bamberg, PA-C  amLODipine (NORVASC) 10 MG tablet Take 1 tablet (10 mg total) by mouth daily. 07/21/19 05/04/20  Storm Frisk, MD    Allergies    Patient has no known allergies.  Review of Systems   Review of Systems  Respiratory:  Positive for chest tightness and shortness of breath.   Cardiovascular:  Positive for chest pain.  Psychiatric/Behavioral:  The patient is nervous/anxious.   All other systems reviewed and are negative.  Physical Exam Updated Vital Signs BP (!) 138/93 (BP Location: Left Arm)   Pulse 71   Temp 98.4 F (36.9 C) (Oral)   Resp 18   Ht 5\' 5"  (1.651 m)   Wt 80.3 kg   SpO2 100%   BMI 29.45 kg/m   Physical Exam Vitals and nursing note reviewed.  Constitutional:      General: He is not in acute distress.    Appearance: Normal appearance.     Comments: Anxious tearful patient sitting in chair in no acute distress.  HENT:     Head: Normocephalic and atraumatic.  Eyes:     General:        Right eye: No discharge.        Left eye: No discharge.  Cardiovascular:     Rate and Rhythm: Normal rate and regular rhythm.     Heart sounds: No murmur heard.   No friction rub. No gallop.     Comments: Some transient tachycardia, however decreased to normal rate during examination.  Tachycardia was only transient right after arrival. Pulmonary:     Effort: Pulmonary effort is normal.     Breath sounds: Normal breath sounds.  Abdominal:     General: Bowel sounds are normal.     Palpations: Abdomen is soft.  Skin:    General: Skin is warm and dry.     Capillary Refill: Capillary refill takes less than 2 seconds.  Neurological:     Mental Status: He is alert and oriented to person, place, and time.  Psychiatric:        Mood and Affect: Mood normal.        Behavior: Behavior normal.    ED Results / Procedures / Treatments   Labs (all labs ordered are listed, but only abnormal results are  displayed) Labs Reviewed  BASIC METABOLIC PANEL - Abnormal; Notable for the following components:      Result Value   Potassium 3.4 (*)    All other components within normal limits  CBC - Abnormal; Notable for the following components:   Hemoglobin 17.8 (*)    All other components within normal limits  TROPONIN I (HIGH SENSITIVITY)  TROPONIN I (  HIGH SENSITIVITY)    EKG None  Radiology DG Chest 2 View  Result Date: 08/08/2021 CLINICAL DATA:  Covid+ 9/20. Was getting better til 2-3days ago when he started having sob, cp, dizziness. EXAM: CHEST - 2 VIEW COMPARISON:  07/25/2021. FINDINGS: Normal heart, mediastinum and hila. Clear lungs.  No pleural effusion or pneumothorax. Skeletal structures are within normal limits. IMPRESSION: Normal chest radiographs. Electronically Signed   By: Amie Portland M.D.   On: 08/08/2021 10:21    Procedures Procedures   Medications Ordered in ED Medications - No data to display  ED Course  I have reviewed the triage vital signs and the nursing notes.  Pertinent labs & imaging results that were available during my care of the patient were reviewed by me and considered in my medical decision making (see chart for details).    MDM Rules/Calculators/A&P                         Patient with nonexertional chest pain, shortness of breath.  Patient has a heart score of 1.  Patient has a long history of chest pain related to panic attacks, also wit recent COVID diagnosis 07/25/21. Patient has 2 negative troponins.  Patient has a negative EKG, transient tachycardia.  Patient has an unremarkable chest x-ray, no evidence of infiltrate or pneumonia.  Given the large differential diagnosis for Clay Solum, the decision making in this case is of high complexity.  After evaluating all of the data points in this case, the presentation of Lyall Faciane is NOT consistent with Acute Coronary Syndrome (ACS) and/or myocardial ischemia, pulmonary embolism, aortic  dissection; Borhaave's, significant arrythmia, pneumothorax, cardiac tamponade, or other emergent cardiopulmonary condition.  Further, the presentation of Rayansh Herbst is NOT consistent with pericarditis, myocarditis, cholecystitis, pancreatitis, mediastinitis, endocarditis, new valvular disease.  Additionally, the presentation of Kassim Guertin is NOT consistent with flail chest, cardiac contusion, ARDS, or significant intra-thoracic or intra-abdominal bleeding.  Moreover, this presentation is NOT consistent with pneumonia, sepsis, or pyelonephritis.  The patient has a diagnosed history of anxiety, and panic disorder.  He has been prescribed Zoloft in the past, however he does not like the way that it made him feel and so he is not currently taking it anymore.  Patient does have a primary care doctor, and is willing to talk to her about further treatment for his anxiety, panic attacks.  In context of patient's recent COVID infection and some ongoing nausea, will provide Zofran at this time.  Strict return and follow-up precautions have been given by me personally or by detailed written instruction given verbally by nursing staff using the teach back method to the patient/family/caregiver(s).  Data Reviewed/Counseling: I have reviewed the patient's vital signs, nursing notes, and other relevant tests/information. I had a detailed discussion regarding the historical points, exam findings, and any diagnostic results supporting the discharge diagnosis. I also discussed the need for outpatient follow-up and the need to return to the ED if symptoms worsen or if there are any questions or concerns that arise at home.  Final Clinical Impression(s) / ED Diagnoses Final diagnoses:  Shortness of breath  Anxiety  Nausea    Rx / DC Orders ED Discharge Orders          Ordered    ondansetron (ZOFRAN ODT) 4 MG disintegrating tablet  Every 8 hours PRN        08/08/21 1550  Olene Floss, PA-C 08/08/21 1551    Benjiman Core, MD 08/09/21 [phone]

## 2021-08-08 NOTE — ED Triage Notes (Addendum)
Pt c/o shortness of breath and chest pain x 2 days. States unable to take deep breath. Tested positive for covid on 07/25/21. States dizziness when standing up. Also states hx of anxiety.

## 2021-08-23 ENCOUNTER — Other Ambulatory Visit: Payer: Self-pay

## 2021-08-23 ENCOUNTER — Ambulatory Visit
Admission: EM | Admit: 2021-08-23 | Discharge: 2021-08-23 | Disposition: A | Discharge status: Home / Self Care | Payer: Self-pay | Attending: Physician Assistant | Admitting: Physician Assistant

## 2021-08-23 DIAGNOSIS — K047 Periapical abscess without sinus: Secondary | ICD-10-CM

## 2021-08-23 MED ORDER — AMOXICILLIN 500 MG PO CAPS: 500.0000 mg | ORAL_CAPSULE | Freq: Three times a day (TID) | ORAL | 0 refills | Status: DC

## 2021-08-23 NOTE — ED Provider Notes (Signed)
[facility]    CSN: [id number] Arrival date & time: 08/23/21  6387      History   Chief Complaint Chief Complaint  Patient presents with   Dental Pain    HPI Olen Eaves is a 30 y.o. male.   Patient here today for evaluation of right-sided lower dental pain that has been ongoing for the last several weeks but worsened over the last few days.  He denies any fever or chills.  He has not had any nausea or vomiting. He states he is now experiencing some right sided ear and throat pain.  He has tried [place] powder without significant relief.   The history is provided by the patient.   Past Medical History:  Diagnosis Date   A-fib [facility])    Anxiety    Atrial fibrillation (HCC) 05/19/2015   Heart murmur    History of palpitations     Patient Active Problem List   Diagnosis Date Noted   Essential hypertension 07/21/2019   Onychomycosis of toenail 07/21/2019   Tinea pedis of both feet 07/21/2019   Tobacco use disorder 07/21/2019   Panic disorder 06/16/2019   Insomnia due to other mental disorder 06/16/2019    Past Surgical History:  Procedure Laterality Date   ADENOIDECTOMY     TONSILLECTOMY         Home Medications    Prior to Admission medications   Medication Sig Start Date End Date Taking? Authorizing Provider  amoxicillin (AMOXIL) 500 MG capsule Take 1 capsule (500 mg total) by mouth 3 (three) times daily. 08/23/21  Yes Tomi Bamberger, PA-C  chlorhexidine (PERIDEX) 0.12 % solution Use as directed 15 mLs in the mouth or throat 2 (two) times daily. Patient not taking: Reported on 07/24/2021 07/28/20   Palumbo, April, MD  cyclobenzaprine (FLEXERIL) 10 MG tablet Take 1 tablet (10 mg total) by mouth 2 (two) times daily as needed for muscle spasms. Patient not taking: Reported on 07/24/2021 05/04/21   Pollyann Savoy, MD  fluticasone [facility]) 50 MCG/ACT nasal spray Place 2 sprays into both nostrils daily. Patient not taking: Reported on 07/24/2021  05/17/21   Wallis Bamberg, PA-C  levocetirizine (XYZAL) 5 MG tablet Take 1 tablet (5 mg total) by mouth every evening. Patient not taking: Reported on 07/24/2021 05/17/21   Wallis Bamberg, PA-C  lidocaine (XYLOCAINE) 2 % solution Use as directed 15 mLs in the mouth or throat as needed for mouth pain. Patient not taking: Reported on 07/24/2021 07/06/21   Mesner, Barbara Cower, MD  mupirocin ointment (BACTROBAN) 2 % Apply 1 application topically 2 (two) times daily. Patient not taking: Reported on 07/24/2021 05/04/20   Belinda Fisher, PA-C  naproxen (NAPROSYN) 500 MG tablet Take 1 tablet (500 mg total) by mouth 2 (two) times daily with a meal. Patient not taking: Reported on 07/24/2021 05/17/21   Wallis Bamberg, PA-C  ondansetron (ZOFRAN ODT) 4 MG disintegrating tablet Take 1 tablet (4 mg total) by mouth every 8 (eight) hours as needed for nausea or vomiting. 08/08/21   Prosperi, Christian H, PA-C  sertraline (ZOLOFT) 50 MG tablet Take 1 tablet (50 mg total) by mouth daily. 1/2 tab PO daily x 2 wks then 1 tab PO daily. Patient not taking: Reported on 07/24/2021 07/21/19   Storm Frisk, MD  tiZANidine (ZANAFLEX) 4 MG tablet Take 1 tablet (4 mg total) by mouth every 8 (eight) hours as needed for muscle spasms. Patient not taking: Reported on 07/24/2021 05/17/21   Wallis Bamberg,  PA-C  amLODipine (NORVASC) 10 MG tablet Take 1 tablet (10 mg total) by mouth daily. 07/21/19 05/04/20  Storm Frisk, MD    Family History Family History  Adopted: Yes  Problem Relation Age of Onset   Healthy Father    Healthy Mother     Social History Social History   Tobacco Use   Smoking status: Some Days    Packs/day: 2.00    Years: 5.00    Pack years: 10.00    Types: Cigars, Cigarettes   Smokeless tobacco: Never  Vaping Use   Vaping Use: Never used  Substance Use Topics   Alcohol use: Yes   Drug use: No     Allergies   Patient has no known allergies.   Review of Systems Review of Systems  Constitutional:  Negative for chills  and fever.  HENT:  Positive for dental problem and ear pain.   Eyes:  Negative for discharge and redness.  Respiratory:  Negative for shortness of breath.   Gastrointestinal:  Negative for nausea and vomiting.  Skin:  Positive for color change and wound.  Neurological:  Negative for numbness.    Physical Exam Triage Vital Signs ED Triage Vitals  Enc Vitals Group     BP      Pulse      Resp      Temp      Temp src      SpO2      Weight      Height      Head Circumference      Peak Flow      Pain Score      Pain Loc      Pain Edu?      Excl. in GC?    No data found.  Updated Vital Signs BP (!) 156/106 (BP Location: Left Arm)   Pulse 97   Temp 98.1 F (36.7 C) (Oral)   Resp 18   SpO2 97%       Physical Exam Vitals and nursing note reviewed.  Constitutional:      General: He is not in acute distress.    Appearance: Normal appearance. He is not ill-appearing.  HENT:     Head: Normocephalic and atraumatic.     Mouth/Throat:     Comments: Significant erythema and gingival swelling around back right molars  Eyes:     Conjunctiva/sclera: Conjunctivae normal.  Cardiovascular:     Rate and Rhythm: Normal rate.  Pulmonary:     Effort: Pulmonary effort is normal.  Neurological:     Mental Status: He is alert.  Psychiatric:        Mood and Affect: Mood normal.        Behavior: Behavior normal.        Thought Content: Thought content normal.     UC Treatments / Results  Labs (all labs ordered are listed, but only abnormal results are displayed) Labs Reviewed - No data to display  EKG   Radiology No results found.  Procedures Procedures (including critical care time)  Medications Ordered in UC Medications - No data to display  Initial Impression / Assessment and Plan / UC Course  I have reviewed the triage vital signs and the nursing notes.  Pertinent labs & imaging results that were available during my care of the patient were reviewed by me and  considered in my medical decision making (see chart for details).   Suspect likely abscess and will treat with antibiotic  for coverage of same. Recommend follow up with dentistry or with our office sooner if symptoms are not improving with treatment.   Final Clinical Impressions(s) / UC Diagnoses   Final diagnoses:  Dental abscess     Discharge Instructions      Take antibiotic as prescribed. Follow up with dentistry with any further concerns. Follow up in office if symptoms fail to improve with treatment.      ED Prescriptions     Medication Sig Dispense Auth. Provider   amoxicillin (AMOXIL) 500 MG capsule Take 1 capsule (500 mg total) by mouth 3 (three) times daily. 21 capsule Tomi Bamberger, PA-C      PDMP not reviewed this encounter.   Tomi Bamberger, PA-C 08/23/21 1052

## 2021-08-23 NOTE — Discharge Instructions (Addendum)
Take antibiotic as prescribed. Follow up with dentistry with any further concerns. Follow up in office if symptoms fail to improve with treatment.

## 2021-08-23 NOTE — ED Triage Notes (Signed)
No answer x2 in waiting, voice message left.

## 2021-08-23 NOTE — ED Triage Notes (Signed)
Pt c/o rt upper toothache x2 days. States taking goodies powder with relief. Pt c/o rt ear and sore throat x2 days.

## 2022-01-19 ENCOUNTER — Other Ambulatory Visit: Payer: Self-pay

## 2022-01-19 ENCOUNTER — Emergency Department (HOSPITAL_BASED_OUTPATIENT_CLINIC_OR_DEPARTMENT_OTHER)
Admission: EM | Admit: 2022-01-19 | Discharge: 2022-01-19 | Disposition: A | Discharge status: Home / Self Care | Payer: Self-pay | Attending: Emergency Medicine | Admitting: Emergency Medicine

## 2022-01-19 ENCOUNTER — Encounter (HOSPITAL_BASED_OUTPATIENT_CLINIC_OR_DEPARTMENT_OTHER): Payer: Self-pay | Admitting: Emergency Medicine

## 2022-01-19 DIAGNOSIS — I10 Essential (primary) hypertension: Secondary | ICD-10-CM | POA: Insufficient documentation

## 2022-01-19 DIAGNOSIS — J069 Acute upper respiratory infection, unspecified: Secondary | ICD-10-CM | POA: Insufficient documentation

## 2022-01-19 NOTE — ED Provider Notes (Signed)
?[facility] EMERGENCY DEPARTMENT ?Provider Note ? ? ?CSN: [id number] ?Arrival date & time: 01/19/22  0254 ? ?  ? ?History ? ?Chief Complaint  ?Patient presents with  ? URI  ? ? ?Mario Luna is a 31 y.o. male. ? ?The history is provided by the patient.  ?URI ?Mario Luna is a 31 y.o. male who presents to the Emergency Department complaining of URI.  He presents to the ED for evaluation of several days of URI sxs with cough, sneeze, body aches fatigue for several days.  Has been taking OTC cold medications with partial improvement in sxs.  Feels fatigued today. ? ?No fever, sob, N/V, leg swelling/pain.   ? ?No known sick contacts.   ? ?Has a hx/o anxiety, HTN.   ?  ? ?Home Medications ?Prior to Admission medications   ?Medication Sig Start Date End Date Taking? Authorizing Provider  ?amLODipine (NORVASC) 10 MG tablet Take 1 tablet (10 mg total) by mouth daily. 07/21/19 05/04/20  Storm Frisk, MD  ?   ? ?Allergies    ?Patient has no known allergies.   ? ?Review of Systems   ?Review of Systems  ?All other systems reviewed and are negative. ? ?Physical Exam ?Updated Vital Signs ?BP (!) 160/84   Pulse 95   Temp 97.8 ?F (36.6 ?C) (Oral)   Resp 16   Ht 5\' 5"  (1.651 m)   Wt 81.6 kg   SpO2 99%   BMI 29.95 kg/m?  ?Physical Exam ?Vitals and nursing note reviewed.  ?Constitutional:   ?   Appearance: He is well-developed.  ?HENT:  ?   Head: Normocephalic and atraumatic.  ?   Right Ear: Tympanic membrane normal.  ?   Left Ear: Tympanic membrane normal.  ?Cardiovascular:  ?   Rate and Rhythm: Normal rate and regular rhythm.  ?   Heart sounds: No murmur heard. ?Pulmonary:  ?   Effort: Pulmonary effort is normal. No respiratory distress.  ?   Breath sounds: Normal breath sounds.  ?Abdominal:  ?   Palpations: Abdomen is soft.  ?   Tenderness: There is no abdominal tenderness. There is no guarding or rebound.  ?Musculoskeletal:     ?   General: No tenderness.  ?Skin: ?   General: Skin is warm and dry.   ?Neurological:  ?   Mental Status: He is alert and oriented to person, place, and time.  ?Psychiatric:     ?   Behavior: Behavior normal.  ? ? ?ED Results / Procedures / Treatments   ?Labs ?(all labs ordered are listed, but only abnormal results are displayed) ?Labs Reviewed - No data to display ? ?EKG ?None ? ?Radiology ?No results found. ? ?Procedures ?Procedures  ? ? ?Medications Ordered in ED ?Medications - No data to display ? ?ED Course/ Medical Decision Making/ A&P ?  ?                        ?Medical Decision Making ? ?Patient here for evaluation of cough, body aches, congestion and sneezing.  On evaluation he is well-hydrated appearing with no respiratory distress.  No clinical evidence of serious bacterial infection or pneumonia.  Discussed with patient likely viral URI.  Offered COVID testing and patient declines at this time.  Counseled patient on home care for URI as well as return precautions.  Discussed continuing OTC medications as needed. ? ? ? ? ? ? ? ?Final Clinical Impression(s) / ED Diagnoses ?Final diagnoses:  ?  Viral URI with cough  ? ? ?Rx / DC Orders ?ED Discharge Orders   ? ? None  ? ?  ? ? ?  ?Tilden Fossa, MD ?01/19/22 [phone] ? ?

## 2022-01-19 NOTE — ED Triage Notes (Signed)
Pt c/o cough, congestion, body aches x several days. Denies fever ?

## 2022-01-23 ENCOUNTER — Emergency Department (HOSPITAL_BASED_OUTPATIENT_CLINIC_OR_DEPARTMENT_OTHER)
Admission: EM | Admit: 2022-01-23 | Discharge: 2022-01-23 | Disposition: A | Discharge status: Home / Self Care | Payer: Self-pay | Attending: Emergency Medicine | Admitting: Emergency Medicine

## 2022-01-23 ENCOUNTER — Encounter (HOSPITAL_BASED_OUTPATIENT_CLINIC_OR_DEPARTMENT_OTHER): Payer: Self-pay

## 2022-01-23 ENCOUNTER — Other Ambulatory Visit: Payer: Self-pay

## 2022-01-23 DIAGNOSIS — M542 Cervicalgia: Secondary | ICD-10-CM | POA: Insufficient documentation

## 2022-01-23 DIAGNOSIS — M62838 Other muscle spasm: Secondary | ICD-10-CM

## 2022-01-23 DIAGNOSIS — M7918 Myalgia, other site: Secondary | ICD-10-CM

## 2022-01-23 DIAGNOSIS — M546 Pain in thoracic spine: Secondary | ICD-10-CM | POA: Insufficient documentation

## 2022-01-23 DIAGNOSIS — F17200 Nicotine dependence, unspecified, uncomplicated: Secondary | ICD-10-CM | POA: Insufficient documentation

## 2022-01-23 MED ORDER — LIDOCAINE 5 % EX PTCH: 1.0000 | MEDICATED_PATCH | CUTANEOUS | 0 refills | Status: DC

## 2022-01-23 MED ORDER — METHOCARBAMOL 500 MG PO TABS: 500.0000 mg | ORAL_TABLET | Freq: Two times a day (BID) | ORAL | 0 refills | Status: DC

## 2022-01-23 MED ORDER — KETOROLAC TROMETHAMINE 30 MG/ML IJ SOLN
12.0000 mg | Freq: Once | INTRAMUSCULAR | Status: AC
  Administered 2022-01-23: 12 mg via INTRAMUSCULAR
  Filled 2022-01-23: qty 1

## 2022-01-23 MED ORDER — LIDOCAINE 5 % EX PTCH
2.0000 | MEDICATED_PATCH | CUTANEOUS | Status: DC
  Administered 2022-01-23: 2 via TRANSDERMAL
  Filled 2022-01-23: qty 2

## 2022-01-23 MED ORDER — DICLOFENAC SODIUM 50 MG PO TBEC: 50.0000 mg | DELAYED_RELEASE_TABLET | Freq: Two times a day (BID) | ORAL | 0 refills | Status: AC

## 2022-01-23 NOTE — ED Triage Notes (Signed)
C/o neck/back pain the past few days. States has been sick the past week. ?

## 2022-01-23 NOTE — ED Provider Notes (Signed)
?[facility] ?Provider Note ? ? ?CSN: [id number] ?Arrival date & time: 01/23/22  1040 ? ?  ? ?History ? ?Chief Complaint  ?Patient presents with  ? Neck Pain  ? ? ?Mario Luna is a 31 y.o. male. ? ?31 year old male presents with complaint of pain in bilateral shoulders extending up to his neck resulting in a headache.  Patient states that he had a cold for the past week, his URI symptoms are improving however he continues to have some body aches in his neck and back.  He denies fevers, weakness or numbness, difficulty swallowing or painful swallowing.  Patient is taking over-the-counter pain reliever without improvement in his symptoms.  History of a-fib (not currently on meds), occasional smoker.  ? ? ?  ? ?Home Medications ?Prior to Admission medications   ?Medication Sig Start Date End Date Taking? Authorizing Provider  ?diclofenac (VOLTAREN) 50 MG EC tablet Take 1 tablet (50 mg total) by mouth 2 (two) times daily for 10 days. 01/23/22 02/02/22 Yes Tacy Learn, PA-C  ?lidocaine (LIDODERM) 5 % Place 1 patch onto the skin daily. Remove & Discard patch within 12 hours or as directed by MD 01/23/22  Yes Tacy Learn, PA-C  ?methocarbamol (ROBAXIN) 500 MG tablet Take 1 tablet (500 mg total) by mouth 2 (two) times daily. 01/23/22  Yes Tacy Learn, PA-C  ?amLODipine (NORVASC) 10 MG tablet Take 1 tablet (10 mg total) by mouth daily. 07/21/19 05/04/20  Elsie Stain, MD  ?   ? ?Allergies    ?Patient has no known allergies.   ? ?Review of Systems   ?Review of Systems ?Negative except as per HPI. ?Physical Exam ?Updated Vital Signs ?BP (!) 139/97 (BP Location: Right Arm)   Pulse 87   Temp 98.3 ?F (36.8 ?C) (Oral)   Resp 16   Ht 5\' 5"  (1.651 m)   Wt 81.6 kg   SpO2 100%   BMI 29.95 kg/m?  ?Physical Exam ?Vitals and nursing note reviewed.  ?Constitutional:   ?   General: He is not in acute distress. ?   Appearance: He is well-developed. He is not diaphoretic.  ?HENT:  ?   Head:  Normocephalic and atraumatic.  ?   Mouth/Throat:  ?   Mouth: Mucous membranes are moist.  ?   Pharynx: Oropharynx is clear. No oropharyngeal exudate or posterior oropharyngeal erythema.  ?Neck:  ?   Comments: Palpable spasm with TTP left trapezius, tenderness bilateral trapezius areas, palpable spasm with TTP right SCM proximally.  ?Cardiovascular:  ?   Pulses: Normal pulses.  ?Pulmonary:  ?   Effort: Pulmonary effort is normal.  ?Musculoskeletal:     ?   General: Tenderness present. No swelling or deformity.  ?   Cervical back: Tenderness present. No rigidity. Muscular tenderness present. No spinous process tenderness.  ?Lymphadenopathy:  ?   Cervical: No cervical adenopathy.  ?Skin: ?   General: Skin is warm and dry.  ?   Findings: No erythema or rash.  ?Neurological:  ?   Mental Status: He is alert and oriented to person, place, and time.  ?   Sensory: No sensory deficit.  ?   Motor: No weakness.  ?Psychiatric:     ?   Behavior: Behavior normal.  ? ? ?ED Results / Procedures / Treatments   ?Labs ?(all labs ordered are listed, but only abnormal results are displayed) ?Labs Reviewed - No data to display ? ?EKG ?None ? ?Radiology ?No results found. ? ?  Procedures ?Procedures  ? ? ?Medications Ordered in ED ?Medications  ?lidocaine (LIDODERM) 5 % 2 patch (has no administration in time range)  ?ketorolac (TORADOL) 30 MG/ML injection 12 mg (has no administration in time range)  ? ? ?ED Course/ Medical Decision Making/ A&P ?  ?                        ?Medical Decision Making ? ?31 year old male with complaint of pain through his neck and shoulder areas for the past several days following resolution of his URI symptoms.  Minimal relief with OTC pain reliever.  Patient denies fever, difficulty breathing or painful swallowing.  He is found to tenderness palpation through left and right trapezius areas with a palpable spasm to her left trapezius with palpation which reproduces his pain.  Also found to have spasm of his right  SCM proximally.  There is no midline or bony tenderness appreciated through the neck or back.  Gait is normal, normal upper and lower extremity strength. ?Plan is to treat with Toradol in the ER with Lidoderm patch.  Prescription for Robaxin, diclofenac, Lidoderm patch to treat his suspected musculoskeletal pain and spasm.  Recommend warm compresses followed by gentle stretching and recheck with his primary care provider. ? ? ? ? ? ? ? ?Final Clinical Impression(s) / ED Diagnoses ?Final diagnoses:  ?Muscle spasms of neck  ?Musculoskeletal pain  ? ? ?Rx / DC Orders ?ED Discharge Orders   ? ?      Ordered  ?  methocarbamol (ROBAXIN) 500 MG tablet  2 times daily       ? 01/23/22 1238  ?  diclofenac (VOLTAREN) 50 MG EC tablet  2 times daily       ? 01/23/22 1238  ?  lidocaine (LIDODERM) 5 %  Every 24 hours       ? 01/23/22 1238  ? ?  ?  ? ?  ? ? ?  ?Tacy Learn, PA-C ?01/23/22 1243 ? ?  ?Gareth Morgan, MD ?01/24/22 1708 ? ?

## 2022-01-23 NOTE — Discharge Instructions (Addendum)
Recheck with your doctor, return to the ER for worsening or concerning symptoms. ?Warm compress to sore muscles for 20 minutes followed by gentle stretching as discussed. ?Robaxin and Diclofenac as needed as prescribed. Do not driver or operate machinery while taking Robaxin as this medication can make you sleepy. ?Apply lidoderm patch as needed as prescribed.  ?

## 2022-02-20 ENCOUNTER — Other Ambulatory Visit: Payer: Self-pay

## 2022-02-20 ENCOUNTER — Encounter (HOSPITAL_BASED_OUTPATIENT_CLINIC_OR_DEPARTMENT_OTHER): Payer: Self-pay

## 2022-02-20 ENCOUNTER — Emergency Department (HOSPITAL_BASED_OUTPATIENT_CLINIC_OR_DEPARTMENT_OTHER)
Admission: EM | Admit: 2022-02-20 | Discharge: 2022-02-20 | Disposition: A | Discharge status: Home / Self Care | Payer: Self-pay | Attending: Emergency Medicine | Admitting: Emergency Medicine

## 2022-02-20 ENCOUNTER — Emergency Department (HOSPITAL_BASED_OUTPATIENT_CLINIC_OR_DEPARTMENT_OTHER): Payer: Self-pay

## 2022-02-20 ENCOUNTER — Other Ambulatory Visit (HOSPITAL_BASED_OUTPATIENT_CLINIC_OR_DEPARTMENT_OTHER): Payer: Self-pay

## 2022-02-20 DIAGNOSIS — Z20822 Contact with and (suspected) exposure to covid-19: Secondary | ICD-10-CM | POA: Insufficient documentation

## 2022-02-20 DIAGNOSIS — R079 Chest pain, unspecified: Secondary | ICD-10-CM | POA: Insufficient documentation

## 2022-02-20 LAB — RESP PANEL BY RT-PCR (FLU A&B, COVID) ARPGX2
Influenza A by PCR: NEGATIVE
Influenza B by PCR: NEGATIVE
SARS Coronavirus 2 by RT PCR: NEGATIVE

## 2022-02-20 MED ORDER — METHOCARBAMOL 500 MG PO TABS
500.0000 mg | ORAL_TABLET | Freq: Two times a day (BID) | ORAL | 0 refills | Status: DC
  Filled 2022-02-20: qty 20, 10d supply, fill #0

## 2022-02-20 NOTE — ED Provider Notes (Signed)
?[facility] EMERGENCY DEPARTMENT ?Provider Note ? ? ?CSN: [id number] ?Arrival date & time: 02/20/22  0740 ? ?  ? ?History ? ?Chief Complaint  ?Patient presents with  ? Chest Pain  ? ? ?Mario Luna is a 31 y.o. male. ? ?The history is provided by the patient.  ?Chest Pain ?Pain location:  L chest ?Pain quality comment:  Spasm ?Pain radiates to:  Does not radiate ?Pain severity:  Mild ?Duration:  2 days ?Timing:  Intermittent ?Progression:  Waxing and waning ?Chronicity:  New ?Context: at rest   ?Relieved by:  Nothing ?Worsened by:  Nothing ?Associated symptoms: no abdominal pain, no altered mental status, no anorexia, no anxiety, no back pain, no claudication, no cough, no diaphoresis, no dizziness, no dysphagia, no fatigue, no fever, no headache, no heartburn, no lower extremity edema, no nausea, no near-syncope, no numbness, no orthopnea, no palpitations, no PND, no shortness of breath, no syncope, no vomiting and no weakness   ? ?  ? ?Home Medications ?Prior to Admission medications   ?Medication Sig Start Date End Date Taking? Authorizing Provider  ?lidocaine (LIDODERM) 5 % Place 1 patch onto the skin daily. Remove & Discard patch within 12 hours or as directed by MD 01/23/22   Jeannie Fend, PA-C  ?methocarbamol (ROBAXIN) 500 MG tablet Take 1 tablet (500 mg total) by mouth 2 (two) times daily. 02/20/22   Ahmet Schank, DO  ?amLODipine (NORVASC) 10 MG tablet Take 1 tablet (10 mg total) by mouth daily. 07/21/19 05/04/20  Storm Frisk, MD  ?   ? ?Allergies    ?Patient has no known allergies.   ? ?Review of Systems   ?Review of Systems  ?Constitutional:  Negative for diaphoresis, fatigue and fever.  ?HENT:  Negative for trouble swallowing.   ?Respiratory:  Negative for cough and shortness of breath.   ?Cardiovascular:  Positive for chest pain. Negative for palpitations, orthopnea, claudication, syncope, PND and near-syncope.  ?Gastrointestinal:  Negative for abdominal pain, anorexia, heartburn, nausea  and vomiting.  ?Musculoskeletal:  Negative for back pain.  ?Neurological:  Negative for dizziness, weakness, numbness and headaches.  ? ?Physical Exam ?Updated Vital Signs ?BP 135/79 (BP Location: Right Arm)   Pulse 70   Temp 98.3 ?F (36.8 ?C) (Oral)   Resp 16   Ht 5\' 5"  (1.651 m)   Wt 79.4 kg   SpO2 100%   BMI 29.12 kg/m?  ?Physical Exam ?Vitals and nursing note reviewed.  ?Constitutional:   ?   General: He is not in acute distress. ?   Appearance: He is well-developed. He is not ill-appearing.  ?HENT:  ?   Head: Normocephalic and atraumatic.  ?Eyes:  ?   Extraocular Movements: Extraocular movements intact.  ?   Conjunctiva/sclera: Conjunctivae normal.  ?   Pupils: Pupils are equal, round, and reactive to light.  ?Cardiovascular:  ?   Rate and Rhythm: Normal rate and regular rhythm.  ?   Pulses:     ?     Radial pulses are 2+ on the right side and 2+ on the left side.  ?   Heart sounds: Normal heart sounds. No murmur heard. ?Pulmonary:  ?   Effort: Pulmonary effort is normal. No respiratory distress.  ?   Breath sounds: Normal breath sounds. No decreased breath sounds, wheezing or rhonchi.  ?Chest:  ?   Chest wall: Tenderness present.  ?Abdominal:  ?   Palpations: Abdomen is soft.  ?   Tenderness: There is no abdominal  tenderness.  ?Musculoskeletal:     ?   General: No swelling.  ?   Cervical back: Neck supple.  ?Skin: ?   General: Skin is warm and dry.  ?   Capillary Refill: Capillary refill takes less than 2 seconds.  ?Neurological:  ?   General: No focal deficit present.  ?   Mental Status: He is alert.  ?Psychiatric:     ?   Mood and Affect: Mood normal.  ? ? ?ED Results / Procedures / Treatments   ?Labs ?(all labs ordered are listed, but only abnormal results are displayed) ?Labs Reviewed  ?RESP PANEL BY RT-PCR (FLU A&B, COVID) ARPGX2  ? ? ?EKG ?EKG Interpretation ? ?Date/Time:  Tuesday February 20 2022 07:50:55 EDT ?Ventricular Rate:  63 ?PR Interval:  155 ?QRS Duration: 100 ?QT Interval:  374 ?QTC  Calculation: 383 ?R Axis:   84 ?Text Interpretation: Sinus rhythm ST elev, probable normal early repol pattern Confirmed by Virgina Norfolk [phone]) on 02/20/2022 8:13:51 AM ? ?Radiology ?DG Chest Portable 1 View ? ?Result Date: 02/20/2022 ?CLINICAL DATA:  Chest pain. EXAM: PORTABLE CHEST 1 VIEW COMPARISON:  Chest x-ray August 08, 2021. FINDINGS: The heart size and mediastinal contours are within normal limits. Both lungs are clear. No visible pleural effusions or pneumothorax. No acute osseous abnormality. IMPRESSION: No active disease. Electronically Signed   By: Feliberto Harts M.D.   On: 02/20/2022 08:15   ? ?Procedures ?Procedures  ? ? ?Medications Ordered in ED ?Medications - No data to display ? ?ED Course/ Medical Decision Making/ A&P ?  ?                        ?Medical Decision Making ?Amount and/or Complexity of Data Reviewed ?Radiology: ordered. ? ? ?Mario Luna is a 31 year old male who presents to the ED with chest pain.  Normal vitals.  No fever.  No cardiac risk factors.  PERC negative and doubt PE.  Atypical story and have no concern for ACS.  Heart score 0.  EKG per my review and interpretation shows sinus rhythm.  Benign repolarization pattern.  Unchanged from prior EKGs.  Having somewhat reproducible chest pain on the left.  Feels like a spasm at times.  Chest x-ray was ordered and per my review and interpretation shows no pneumonia or pneumothorax.  No rib fractures.  COVID and flu test was obtained which patient will follow-up with.  He has no fever.  Doubt infectious process.  Suspect that this is costochondritis/musculoskeletal process.  Recommend Tylenol and ibuprofen.  Will write for muscle relaxant.  Discharged in ED in good condition.  Understands return precautions. ? ?This chart was dictated using voice recognition software.  Despite best efforts to proofread,  errors can occur which can change the documentation meaning.  ? ? ? ? ? ? ? ?Final Clinical Impression(s) / ED Diagnoses ?Final  diagnoses:  ?Nonspecific chest pain  ? ? ?Rx / DC Orders ?ED Discharge Orders   ? ?      Ordered  ?  methocarbamol (ROBAXIN) 500 MG tablet  2 times daily       ? 02/20/22 0835  ? ?  ?  ? ?  ? ? ?  ?Virgina Norfolk, DO ?02/20/22 1607 ? ?

## 2022-02-20 NOTE — Discharge Instructions (Addendum)
Chest x-ray shows no evidence of pneumonia.  There is no rib fractures or dropped lung.  Suspect that this is muscular.  Recommend taking 800 mg ibuprofen every 8 hours as needed for pain.  Recommend taking 1000 mg of Tylenol every 6 hours as needed for pain.  I have also prescribed you a muscle relaxant.  This medication is mildly sedating.  So do not use with alcohol or drugs or dangerous activities including driving. ?

## 2022-02-20 NOTE — ED Triage Notes (Signed)
Pt arrives with c/o pain in chest, body aches, back pain X2-3 days.  States he took a Covid test at home but 'it wouldn't read tight' denies any injury. Pt states he needs a COVID test. Denies any fever cough or chills.  ?

## 2022-02-23 ENCOUNTER — Other Ambulatory Visit (HOSPITAL_BASED_OUTPATIENT_CLINIC_OR_DEPARTMENT_OTHER): Payer: Self-pay

## 2022-03-02 ENCOUNTER — Other Ambulatory Visit (HOSPITAL_BASED_OUTPATIENT_CLINIC_OR_DEPARTMENT_OTHER): Payer: Self-pay

## 2022-04-24 ENCOUNTER — Ambulatory Visit
Admission: EM | Admit: 2022-04-24 | Discharge: 2022-04-24 | Disposition: A | Discharge status: Home / Self Care | Payer: Self-pay | Attending: Family Medicine | Admitting: Family Medicine

## 2022-04-24 DIAGNOSIS — N342 Other urethritis: Secondary | ICD-10-CM | POA: Insufficient documentation

## 2022-04-24 MED ORDER — CEFTRIAXONE SODIUM 500 MG IJ SOLR
500.0000 mg | Freq: Once | INTRAMUSCULAR | Status: AC
  Administered 2022-04-24: 500 mg via INTRAMUSCULAR

## 2022-04-24 MED ORDER — DOXYCYCLINE HYCLATE 100 MG PO CAPS: 100.0000 mg | ORAL_CAPSULE | Freq: Two times a day (BID) | ORAL | 0 refills | Status: AC

## 2022-04-24 NOTE — Discharge Instructions (Signed)
Staff will call you if anything is positive on the swab  You have been given a shot of ceftriaxone 500 mg today  Take doxycycline 100 mg--1 capsule 2 times daily for 7 days  If you are positive on the swab, you should have any contacts notified
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
## 2022-04-24 NOTE — ED Provider Notes (Signed)
[facility]    CSN: [id number] Arrival date & time: 04/24/22  1938      History   Chief Complaint Chief Complaint  Patient presents with   Exposure to STD    HPI Mario Luna is a 31 y.o. male.    Exposure to STD   Here for penile discharge that began yesterday.  No itching and no ulcerations.  It has been burning some when he empties his bladder.  No fever or abdominal pain.  He states he had a bump on his penis that his gone away  Past Medical History:  Diagnosis Date   A-fib [facility])    Anxiety    Atrial fibrillation (HCC) 05/19/2015   Heart murmur    History of palpitations     Patient Active Problem List   Diagnosis Date Noted   Essential hypertension 07/21/2019   Onychomycosis of toenail 07/21/2019   Tinea pedis of both feet 07/21/2019   Tobacco use disorder 07/21/2019   Panic disorder 06/16/2019   Insomnia due to other mental disorder 06/16/2019    Past Surgical History:  Procedure Laterality Date   ADENOIDECTOMY     TONSILLECTOMY         Home Medications    Prior to Admission medications   Medication Sig Start Date End Date Taking? Authorizing Provider  doxycycline (VIBRAMYCIN) 100 MG capsule Take 1 capsule (100 mg total) by mouth 2 (two) times daily for 7 days. 04/24/22 05/01/22 Yes Esco Joslyn, Janace Aris, MD  lidocaine (LIDODERM) 5 % Place 1 patch onto the skin daily. Remove & Discard patch within 12 hours or as directed by MD 01/23/22   Jeannie Fend, PA-C  methocarbamol (ROBAXIN) 500 MG tablet Take 1 tablet (500 mg total) by mouth 2 (two) times daily. 02/20/22   Curatolo, Adam, DO  amLODipine (NORVASC) 10 MG tablet Take 1 tablet (10 mg total) by mouth daily. 07/21/19 05/04/20  Storm Frisk, MD    Family History Family History  Adopted: Yes  Problem Relation Age of Onset   Healthy Father    Healthy Mother     Social History Social History   Tobacco Use   Smoking status: Some Days    Packs/day: 2.00    Years: 5.00     Total pack years: 10.00    Types: Cigars, Cigarettes   Smokeless tobacco: Never  Vaping Use   Vaping Use: Never used  Substance Use Topics   Alcohol use: Yes   Drug use: No     Allergies   Patient has no known allergies.   Review of Systems Review of Systems   Physical Exam Triage Vital Signs ED Triage Vitals  Enc Vitals Group     BP 04/24/22 1947 138/75     Pulse Rate 04/24/22 1947 93     Resp 04/24/22 1947 18     Temp 04/24/22 1947 97.9 F (36.6 C)     Temp Source 04/24/22 1947 Oral     SpO2 04/24/22 1947 98 %     Weight --      Height --      Head Circumference --      Peak Flow --      Pain Score 04/24/22 1946 0     Pain Loc --      Pain Edu? --      Excl. in GC? --    No data found.  Updated Vital Signs BP 138/75   Pulse 93   Temp 97.9  F (36.6 C) (Oral)   Resp 18   SpO2 98%   Visual Acuity Right Eye Distance:   Left Eye Distance:   Bilateral Distance:    Right Eye Near:   Left Eye Near:    Bilateral Near:     Physical Exam Vitals reviewed.  Constitutional:      General: He is not in acute distress.    Appearance: He is not toxic-appearing.  HENT:     Mouth/Throat:     Mouth: Mucous membranes are moist.  Cardiovascular:     Rate and Rhythm: Normal rate and regular rhythm.  Pulmonary:     Effort: Pulmonary effort is normal.     Breath sounds: Normal breath sounds.  Skin:    Coloration: Skin is not pale.  Neurological:     Mental Status: He is alert and oriented to person, place, and time.  Psychiatric:        Behavior: Behavior normal.      UC Treatments / Results  Labs (all labs ordered are listed, but only abnormal results are displayed) Labs Reviewed  CYTOLOGY, (ORAL, ANAL, URETHRAL) ANCILLARY ONLY    EKG   Radiology No results found.  Procedures Procedures (including critical care time)  Medications Ordered in UC Medications  cefTRIAXone (ROCEPHIN) injection 500 mg (500 mg Intramuscular Given 04/24/22 1958)     Initial Impression / Assessment and Plan / UC Course  I have reviewed the triage vital signs and the nursing notes.  Pertinent labs & imaging results that were available during my care of the patient were reviewed by me and considered in my medical decision making (see chart for details).     Swab done.  He wanted to be treated empirically today so he is given a shot of ceftriaxone and doxycycline is sent to the pharmacy Final Clinical Impressions(s) / UC Diagnoses   Final diagnoses:  Urethritis     Discharge Instructions      Staff will call you if anything is positive on the swab  You have been given a shot of ceftriaxone 500 mg today  Take doxycycline 100 mg--1 capsule 2 times daily for 7 days  If you are positive on the swab, you should have any contacts notified    ED Prescriptions     Medication Sig Dispense Auth. Provider   doxycycline (VIBRAMYCIN) 100 MG capsule Take 1 capsule (100 mg total) by mouth 2 (two) times daily for 7 days. 14 capsule Marlinda Mike, Janace Aris, MD      PDMP not reviewed this encounter.   Zenia Resides, MD 04/24/22 [phone]

## 2022-04-25 ENCOUNTER — Emergency Department (HOSPITAL_BASED_OUTPATIENT_CLINIC_OR_DEPARTMENT_OTHER)
Admission: EM | Admit: 2022-04-25 | Discharge: 2022-04-25 | Disposition: A | Discharge status: Home / Self Care | Payer: Self-pay | Attending: Emergency Medicine | Admitting: Emergency Medicine

## 2022-04-25 ENCOUNTER — Telehealth: Payer: Self-pay | Admitting: Emergency Medicine

## 2022-04-25 ENCOUNTER — Other Ambulatory Visit (HOSPITAL_BASED_OUTPATIENT_CLINIC_OR_DEPARTMENT_OTHER): Payer: Self-pay

## 2022-04-25 ENCOUNTER — Other Ambulatory Visit: Payer: Self-pay

## 2022-04-25 ENCOUNTER — Encounter (HOSPITAL_BASED_OUTPATIENT_CLINIC_OR_DEPARTMENT_OTHER): Payer: Self-pay | Admitting: Emergency Medicine

## 2022-04-25 DIAGNOSIS — T148XXA Other injury of unspecified body region, initial encounter: Secondary | ICD-10-CM

## 2022-04-25 DIAGNOSIS — M62830 Muscle spasm of back: Secondary | ICD-10-CM | POA: Insufficient documentation

## 2022-04-25 MED ORDER — KETOROLAC TROMETHAMINE 60 MG/2ML IM SOLN
60.0000 mg | Freq: Once | INTRAMUSCULAR | Status: AC
  Administered 2022-04-25: 60 mg via INTRAMUSCULAR
  Filled 2022-04-25: qty 2

## 2022-04-25 MED ORDER — METHYLPREDNISOLONE 4 MG PO TBPK
ORAL_TABLET | ORAL | 0 refills | Status: DC
  Filled 2022-04-25: qty 21, 6d supply, fill #0

## 2022-04-25 MED ORDER — METHOCARBAMOL 500 MG PO TABS
500.0000 mg | ORAL_TABLET | Freq: Two times a day (BID) | ORAL | 0 refills | Status: DC
  Filled 2022-04-25: qty 20, 10d supply, fill #0

## 2022-04-25 NOTE — Discharge Instructions (Signed)
Recommend 1000 mg of Tylenol every 6 hours as needed for pain.  Recommend taking muscle relaxant as needed, Robaxin.  This medication is sedating so do not use with alcohol or other drugs or dangerous activities including driving.  Take Medrol Dosepak as prescribed, this is a steroid.  After you complete your steroids, recommend that you take 800 mg ibuprofen every 8 hours as needed for pain.

## 2022-04-25 NOTE — Telephone Encounter (Signed)
Patient called asking about prescription and pharmacy.  Verified patient's name and date of birth.  Reassured doxycycline was sent to [facility] , [address], [place] , confirmed by recipt in medical record.  Patient agreeable and thankful at end of calkl

## 2022-04-25 NOTE — ED Triage Notes (Signed)
Patient presents to ED via POV from home. Patient reports mid back pain that began today. Denies recent injury or trauma. Denies bowel/bladder incontinence. Ambulatory with limping gait. Unable to sit in triage.

## 2022-04-25 NOTE — ED Provider Notes (Signed)
[facility] EMERGENCY DEPARTMENT Provider Note   CSN: [id number] Arrival date & time: 04/25/22  0759     History  Chief Complaint  Patient presents with   Back Pain    Mario Luna is a 31 y.o. male.  Is here with left-sided back pain for the last day or 2.  Denies any weakness, numbness, loss of bowel or bladder.  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  Feels like he has a muscle spasm.  Denies any pain into his legs.  Denies any chest pain, shortness of breath.  No history of kidney stones.  History of A-fib but that has been resolved.  Movement makes the pain worse.  Being still makes it better.  Denies any fevers, chills.  No trauma history.  The history is provided by the patient.       Home Medications Prior to Admission medications   Medication Sig Start Date End Date Taking? Authorizing Provider  methylPREDNISolone (MEDROL DOSEPAK) 4 MG TBPK tablet Follow package insert 04/25/22  Yes Mario Smick, DO  doxycycline (VIBRAMYCIN) 100 MG capsule Take 1 capsule (100 mg total) by mouth 2 (two) times daily for 7 days. 04/24/22 05/01/22  Zenia Resides, MD  lidocaine (LIDODERM) 5 % Place 1 patch onto the skin daily. Remove & Discard patch within 12 hours or as directed by MD 01/23/22   Mario Fend, PA-C  methocarbamol (ROBAXIN) 500 MG tablet Take 1 tablet (500 mg total) by mouth 2 (two) times daily. 04/25/22   Mario Fecteau, DO  amLODipine (NORVASC) 10 MG tablet Take 1 tablet (10 mg total) by mouth daily. 07/21/19 05/04/20  Mario Frisk, MD      Allergies    Patient has no known allergies.    Review of Systems   Review of Systems  Physical Exam Updated Vital Signs BP (!) 146/83   Pulse 90   Temp 97.8 F (36.6 C) (Oral)   Resp 20   SpO2 100%  Physical Exam Vitals and nursing note reviewed.  Constitutional:      General: He is not in acute distress.    Appearance: He is well-developed. He is not ill-appearing.  HENT:     Head: Normocephalic and atraumatic.   Eyes:     Extraocular Movements: Extraocular movements intact.     Conjunctiva/sclera: Conjunctivae normal.     Pupils: Pupils are equal, round, and reactive to light.  Cardiovascular:     Rate and Rhythm: Normal rate and regular rhythm.     Pulses: Normal pulses.     Heart sounds: Normal heart sounds. No murmur heard. Pulmonary:     Effort: Pulmonary effort is normal. No respiratory distress.     Breath sounds: Normal breath sounds.  Abdominal:     Palpations: Abdomen is soft.     Tenderness: There is no abdominal tenderness.  Musculoskeletal:        General: Tenderness present. No swelling.     Cervical back: Normal range of motion and neck supple.     Comments: Tenderness to the paraspinal lumbar muscles on the left with increased tone  Skin:    General: Skin is warm and dry.     Capillary Refill: Capillary refill takes less than 2 seconds.  Neurological:     General: No focal deficit present.     Mental Status: He is alert and oriented to person, place, and time.     Cranial Nerves: No cranial nerve deficit.     Sensory: No  sensory deficit.     Motor: No weakness.     Coordination: Coordination normal.     Comments: 5+ out of 5 strength throughout, normal sensation, antalgic gait  Psychiatric:        Mood and Affect: Mood normal.     ED Results / Procedures / Treatments   Labs (all labs ordered are listed, but only abnormal results are displayed) Labs Reviewed - No data to display  EKG None  Radiology No results found.  Procedures Procedures    Medications Ordered in ED Medications  ketorolac (TORADOL) injection 60 mg (has no administration in time range)    ED Course/ Medical Decision Making/ A&P                           Medical Decision Making Risk Prescription drug management.   Mario Luna is here with left lower back pain.  Unremarkable vitals.  No fever.  Differential diagnosis is likely muscle spasm.  I have no concern for fracture, cauda  equina, epidural abscess or infectious process.  No trauma history.  He is tender with increased muscle tone on that side consistent with a spasm.  No history of kidney stone, not consistent with kidney stone.  Pain is worse with movement.  He is neurovascularly neuromuscular intact otherwise.  Normal strength and sensation.  Denies any fevers or chills.  Will give a dose of Toradol.  Will treat with Medrol Dosepak and muscle relaxant.  We will have him follow-up with primary care doctor.  Discharged in good condition.  This chart was dictated using voice recognition software.  Despite best efforts to proofread,  errors can occur which can change the documentation meaning.         Final Clinical Impression(s) / ED Diagnoses Final diagnoses:  Muscle strain  Muscle spasm of back    Rx / DC Orders ED Discharge Orders          Ordered    methocarbamol (ROBAXIN) 500 MG tablet  2 times daily        04/25/22 0809    methylPREDNISolone (MEDROL DOSEPAK) 4 MG TBPK tablet        04/25/22 0809              Mario Norfolk, DO 04/25/22 5093

## 2022-04-26 ENCOUNTER — Other Ambulatory Visit (HOSPITAL_BASED_OUTPATIENT_CLINIC_OR_DEPARTMENT_OTHER): Payer: Self-pay

## 2022-04-30 LAB — CYTOLOGY, (ORAL, ANAL, URETHRAL) ANCILLARY ONLY
Chlamydia: NEGATIVE
Comment: NEGATIVE
Comment: NEGATIVE
Comment: NORMAL
Neisseria Gonorrhea: POSITIVE — AB
Trichomonas: NEGATIVE

## 2022-05-15 ENCOUNTER — Encounter (HOSPITAL_BASED_OUTPATIENT_CLINIC_OR_DEPARTMENT_OTHER): Payer: Self-pay | Admitting: Emergency Medicine

## 2022-05-15 ENCOUNTER — Emergency Department (HOSPITAL_BASED_OUTPATIENT_CLINIC_OR_DEPARTMENT_OTHER)
Admission: EM | Admit: 2022-05-15 | Discharge: 2022-05-15 | Discharge status: Against Medical Advice | Payer: Self-pay | Attending: Emergency Medicine | Admitting: Emergency Medicine

## 2022-05-15 DIAGNOSIS — Z5321 Procedure and treatment not carried out due to patient leaving prior to being seen by health care provider: Secondary | ICD-10-CM | POA: Insufficient documentation

## 2022-05-15 DIAGNOSIS — R519 Headache, unspecified: Secondary | ICD-10-CM | POA: Insufficient documentation

## 2022-05-15 DIAGNOSIS — M549 Dorsalgia, unspecified: Secondary | ICD-10-CM | POA: Insufficient documentation

## 2022-05-15 DIAGNOSIS — R42 Dizziness and giddiness: Secondary | ICD-10-CM | POA: Insufficient documentation

## 2022-05-15 DIAGNOSIS — R110 Nausea: Secondary | ICD-10-CM | POA: Insufficient documentation

## 2022-05-15 MED ORDER — ACETAMINOPHEN 325 MG PO TABS
650.0000 mg | ORAL_TABLET | Freq: Once | ORAL | Status: AC | PRN
  Administered 2022-05-15: 650 mg via ORAL
  Filled 2022-05-15: qty 2

## 2022-05-15 MED ORDER — ONDANSETRON 4 MG PO TBDP
4.0000 mg | ORAL_TABLET | Freq: Once | ORAL | Status: AC | PRN
  Administered 2022-05-15: 4 mg via ORAL
  Filled 2022-05-15: qty 1

## 2022-05-15 NOTE — ED Triage Notes (Signed)
Pt c/o headache, nausea, back pain, dizziness x 2 days.    Seen recently for back pain. States its a recurrent issue.   Pt states he needs to go to work and is only looking for medication for his nausea and headache.

## 2022-06-09 ENCOUNTER — Emergency Department (HOSPITAL_BASED_OUTPATIENT_CLINIC_OR_DEPARTMENT_OTHER)
Admission: EM | Admit: 2022-06-09 | Discharge: 2022-06-09 | Disposition: A | Discharge status: Home / Self Care | Payer: Self-pay | Attending: Emergency Medicine | Admitting: Emergency Medicine

## 2022-06-09 ENCOUNTER — Encounter (HOSPITAL_BASED_OUTPATIENT_CLINIC_OR_DEPARTMENT_OTHER): Payer: Self-pay | Admitting: Emergency Medicine

## 2022-06-09 ENCOUNTER — Other Ambulatory Visit: Payer: Self-pay

## 2022-06-09 DIAGNOSIS — F17210 Nicotine dependence, cigarettes, uncomplicated: Secondary | ICD-10-CM | POA: Insufficient documentation

## 2022-06-09 DIAGNOSIS — N342 Other urethritis: Secondary | ICD-10-CM | POA: Insufficient documentation

## 2022-06-09 MED ORDER — CEFTRIAXONE SODIUM 500 MG IJ SOLR
500.0000 mg | Freq: Once | INTRAMUSCULAR | Status: AC
  Administered 2022-06-09: 500 mg via INTRAMUSCULAR
  Filled 2022-06-09: qty 500

## 2022-06-09 MED ORDER — AZITHROMYCIN 1 G PO PACK
1.0000 g | PACK | Freq: Once | ORAL | Status: AC
  Administered 2022-06-09: 1 g via ORAL
  Filled 2022-06-09 (×2): qty 1

## 2022-06-09 NOTE — ED Triage Notes (Signed)
Pt reports noticing penile discharge tonight

## 2022-06-09 NOTE — ED Provider Notes (Signed)
[facility] Provider Note: Lowella Dell, MD, FACEP  CSN: [id number] MRN: [id number] ARRIVAL: 06/09/22 at 0300 ROOM: [facility]01/[facility]01   CHIEF COMPLAINT  Penile Discharge   HISTORY OF PRESENT ILLNESS  06/09/22 3:41 AM Mario Luna is a 31 y.o. male who noticed a penile discharge this morning.  He is not having pain with it.  He describes the discharge as colorless.  He has been treated for urethritis in the past.   Past Medical History:  Diagnosis Date   Anxiety    Atrial fibrillation (HCC) 05/19/2015   Heart murmur     Past Surgical History:  Procedure Laterality Date   ADENOIDECTOMY     TONSILLECTOMY      Family History  Adopted: Yes  Problem Relation Age of Onset   Healthy Father    Healthy Mother     Social History   Tobacco Use   Smoking status: Some Days    Packs/day: 2.00    Years: 5.00    Total pack years: 10.00    Types: Cigars, Cigarettes   Smokeless tobacco: Never  Vaping Use   Vaping Use: Never used  Substance Use Topics   Alcohol use: Yes   Drug use: No    Prior to Admission medications   Medication Sig Start Date End Date Taking? Authorizing Provider  amLODipine (NORVASC) 10 MG tablet Take 1 tablet (10 mg total) by mouth daily. 07/21/19 05/04/20  Storm Frisk, MD    Allergies Patient has no known allergies.   REVIEW OF SYSTEMS  Negative except as noted here or in the History of Present Illness.   PHYSICAL EXAMINATION  Initial Vital Signs Blood pressure (!) 143/80, pulse 76, temperature 97.9 F (36.6 C), temperature source Oral, resp. rate 16, height 5\' 5"  (1.651 m), weight 81.6 kg, SpO2 100 %.  Examination General: Well-developed, well-nourished male in no acute distress; appearance consistent with age of record HENT: normocephalic; atraumatic Eyes: Normal appearance Neck: supple Heart: regular rate and rhythm Lungs: clear to auscultation bilaterally Abdomen: soft; nondistended; nontender; bowel sounds present GU:  Tanner V male, circumcised; no appreciable urethral discharge Extremities: No deformity; full range of motion Neurologic: Awake, alert and oriented; motor function intact in all extremities and symmetric; no facial droop Skin: Warm and dry Psychiatric: Normal mood and affect   RESULTS  Summary of this visit's results, reviewed and interpreted by myself:   EKG Interpretation  Date/Time:    Ventricular Rate:    PR Interval:    QRS Duration:   QT Interval:    QTC Calculation:   R Axis:     Text Interpretation:         Laboratory Studies: No results found for this or any previous visit (from the past 24 hour(s)). Imaging Studies: No results found.  ED COURSE and MDM  Nursing notes, initial and subsequent vitals signs, including pulse oximetry, reviewed and interpreted by myself.  Vitals:   06/09/22 0309  BP: (!) 143/80  Pulse: 76  Resp: 16  Temp: 97.9 F (36.6 C)  TempSrc: Oral  SpO2: 100%  Weight: 81.6 kg  Height: 5\' 5"  (1.651 m)   Medications  cefTRIAXone (ROCEPHIN) injection 500 mg (has no administration in time range)  azithromycin (ZITHROMAX) powder 1 g (has no administration in time range)    We will go ahead and treat for urethritis.  Will use Zithromax over doxycycline out of compliance concerns.  PROCEDURES  Procedures   ED DIAGNOSES     ICD-10-CM  1. Urethritis  N34.2          Missi Mcmackin, MD 06/09/22 [phone]

## 2022-06-11 LAB — GC/CHLAMYDIA PROBE AMP (~~LOC~~) NOT AT ARMC
Chlamydia: NEGATIVE
Comment: NEGATIVE
Comment: NORMAL
Neisseria Gonorrhea: NEGATIVE

## 2022-08-17 ENCOUNTER — Encounter (HOSPITAL_BASED_OUTPATIENT_CLINIC_OR_DEPARTMENT_OTHER): Payer: Self-pay

## 2022-08-17 ENCOUNTER — Emergency Department (HOSPITAL_BASED_OUTPATIENT_CLINIC_OR_DEPARTMENT_OTHER)
Admission: EM | Admit: 2022-08-17 | Discharge: 2022-08-17 | Disposition: A | Discharge status: Home / Self Care | Payer: Self-pay | Attending: Emergency Medicine | Admitting: Emergency Medicine

## 2022-08-17 ENCOUNTER — Emergency Department (HOSPITAL_BASED_OUTPATIENT_CLINIC_OR_DEPARTMENT_OTHER): Payer: Self-pay

## 2022-08-17 ENCOUNTER — Other Ambulatory Visit: Payer: Self-pay

## 2022-08-17 DIAGNOSIS — M25562 Pain in left knee: Secondary | ICD-10-CM | POA: Insufficient documentation

## 2022-08-17 MED ORDER — ACETAMINOPHEN 325 MG PO TABS
650.0000 mg | ORAL_TABLET | Freq: Once | ORAL | Status: AC
  Administered 2022-08-17: 650 mg via ORAL
  Filled 2022-08-17: qty 2

## 2022-08-17 MED ORDER — IBUPROFEN 800 MG PO TABS: 800.0000 mg | ORAL_TABLET | Freq: Three times a day (TID) | ORAL | 0 refills | Status: DC | PRN

## 2022-08-17 NOTE — ED Triage Notes (Signed)
Pt reports left knee Gave out yesterday while walking. Pain increases with ambulation . No known injury but has hx of knee injury when younger

## 2022-08-17 NOTE — ED Provider Notes (Signed)
[facility] DEPARTMENT Provider Note   CSN: [id number] Arrival date & time: 08/17/22  2536     History  Chief Complaint  Patient presents with   Knee Pain    Mario Luna is a 31 y.o. male.  Patient reports left knee "gave out" while he was walking yesterday".  Denies any fall or injury.  Increased pain with weightbearing and ambulation.  History of knee injury previously but no surgeries.  Does have pain from time to time "when it rains".  No weakness, numbness or tingling.   No Fever or vomiting.  No other joint pain.  Taking Tylenol at home without relief.  Pain increases with walking and improved with rest.  The history is provided by the patient.  Knee Pain      Home Medications Prior to Admission medications   Medication Sig Start Date End Date Taking? Authorizing Provider  amLODipine (NORVASC) 10 MG tablet Take 1 tablet (10 mg total) by mouth daily. 07/21/19 05/04/20  Elsie Stain, MD      Allergies    Patient has no known allergies.    Review of Systems   Review of Systems  Musculoskeletal:  Positive for arthralgias and myalgias.   all other systems are negative except as noted in the HPI and PMH.    Physical Exam Updated Vital Signs Temp 98 F (36.7 C) (Oral)   Ht 5\' 6"  (1.676 m)   Wt 85.3 kg   BMI 30.34 kg/m  Physical Exam Vitals and nursing note reviewed.  Constitutional:      General: He is not in acute distress.    Appearance: He is well-developed.  HENT:     Head: Normocephalic and atraumatic.     Mouth/Throat:     Pharynx: No oropharyngeal exudate.  Eyes:     Conjunctiva/sclera: Conjunctivae normal.     Pupils: Pupils are equal, round, and reactive to light.  Neck:     Comments: No meningismus. Cardiovascular:     Rate and Rhythm: Normal rate and regular rhythm.     Heart sounds: Normal heart sounds. No murmur heard. Pulmonary:     Effort: Pulmonary effort is normal. No respiratory distress.     Breath sounds:  Normal breath sounds.  Abdominal:     Palpations: Abdomen is soft.     Tenderness: There is no abdominal tenderness. There is no guarding or rebound.  Musculoskeletal:        General: Tenderness present. Normal range of motion.     Cervical back: Normal range of motion and neck supple.     Comments: Left knee with medial joint line tenderness.  No effusion or erythema.  Flexion and extension are intact without ligament laxity.  Able to lift leg and keep knee extended.  Intact DP and PT pulses.  Skin:    General: Skin is warm.  Neurological:     Mental Status: He is alert and oriented to person, place, and time.     Cranial Nerves: No cranial nerve deficit.     Motor: No abnormal muscle tone.     Coordination: Coordination normal.     Comments:  5/5 strength throughout. CN 2-12 intact.Equal grip strength.   Psychiatric:        Behavior: Behavior normal.     ED Results / Procedures / Treatments   Labs (all labs ordered are listed, but only abnormal results are displayed) Labs Reviewed - No data to display  EKG None  Radiology No  results found.  Procedures Procedures    Medications Ordered in ED Medications  acetaminophen (TYLENOL) tablet 650 mg (has no administration in time range)    ED Course/ Medical Decision Making/ A&P                           Medical Decision Making Amount and/or Complexity of Data Reviewed Labs: ordered. Decision-making details documented in ED Course. Radiology: ordered and independent interpretation performed. Decision-making details documented in ED Course. ECG/medicine tests: ordered and independent interpretation performed. Decision-making details documented in ED Course.  Risk OTC drugs.   Left knee pain.  Neurovascular intact.  Low suspicion for fracture or dislocation.  X-rays negative.  Results reviewed and interpreted by me.  Will recommend ice, elevation, brace, crutches, weightbearing as tolerated.  Discussed possibility of  ligamentous or tendon injury that would not be evident on x-ray.  Follow-up with PCP and orthopedics.  May need MRI.  Return precautions discussed.        Final Clinical Impression(s) / ED Diagnoses Final diagnoses:  None    Rx / DC Orders ED Discharge Orders     None         Rasaan Brotherton, Jeannett Senior, MD 08/17/22 [phone]

## 2022-08-17 NOTE — Discharge Instructions (Signed)
Your x-ray is negative.  Use anti-inflammatories as prescribed.  Follow-up with your doctor.  As we discussed you may need to have an MRI for further assessment of the ligaments and tendons of your knee.  Return to the ED with new or worsening symptoms.

## 2022-08-21 ENCOUNTER — Ambulatory Visit: Payer: Self-pay

## 2022-08-21 ENCOUNTER — Ambulatory Visit (INDEPENDENT_AMBULATORY_CARE_PROVIDER_SITE_OTHER): Payer: Self-pay | Admitting: Family Medicine

## 2022-08-21 ENCOUNTER — Encounter: Payer: Self-pay | Admitting: Family Medicine

## 2022-08-21 VITALS — BP 148/90 | Ht 66.0 in | Wt 188.0 lb

## 2022-08-21 DIAGNOSIS — S83002A Unspecified subluxation of left patella, initial encounter: Secondary | ICD-10-CM | POA: Insufficient documentation

## 2022-08-21 DIAGNOSIS — M25562 Pain in left knee: Secondary | ICD-10-CM

## 2022-08-21 MED ORDER — MELOXICAM 15 MG PO TABS: ORAL_TABLET | ORAL | 1 refills | Status: DC

## 2022-08-21 NOTE — Addendum Note (Signed)
Addended by: Cresenciano Lick on: 08/21/2022 09:44 AM   Modules accepted: Orders

## 2022-08-21 NOTE — Patient Instructions (Signed)
Nice to meet you Please use ice  Please try the mobic. Please do not take with ibuprofen  You can take tylenol with the mobic  Please try the exercises  You can wean off of the crutches as you tolerate   Please send me a message in [place] with any questions or updates.  Please see me back as needed.   --Dr. Raeford Razor

## 2022-08-21 NOTE — Assessment & Plan Note (Signed)
Acutely occurring.  Findings most consistent with a subluxation.  Has good endpoints with Lachman's testing and no pain with McMurray's test. -Counseled on home exercise therapy and supportive care. -Meloxicam. -Counseled on using the crutches and brace. -Could consider physical therapy or further imaging.

## 2022-08-21 NOTE — Progress Notes (Signed)
  Mario Luna - 31 y.o. male MRN [id number]  Date of birth: 06-23-1991  SUBJECTIVE:  Including CC & ROS.  No chief complaint on file.   [name] is a 31 y.o. male that is presenting with acute left knee pain.  Initially felt the pain the next day after playing basketball.  Now having trouble weightbearing and putting pressure on the knee.  No history of surgery.  Has had injuries to the left knee previously.  Review of the emergency department note from 10/13 shows he was provided crutches and counseled supportive care. Independent review of the left knee x-ray from 10/13 shows no acute changes.  Review of Systems See HPI   HISTORY: Past Medical, Surgical, Social, and Family History Reviewed & Updated per EMR.   Pertinent Historical Findings include:  Past Medical History:  Diagnosis Date   Anxiety    Atrial fibrillation ([facility]) 05/19/2015   Heart murmur     Past Surgical History:  Procedure Laterality Date   ADENOIDECTOMY     TONSILLECTOMY       PHYSICAL EXAM:  VS: BP (!) 148/90 (BP Location: Right Arm, Patient Position: Sitting)   Ht 5\' 6"  (1.676 m)   Wt 188 lb (85.3 kg)   BMI 30.34 kg/m  Physical Exam Gen: NAD, alert, cooperative with exam, well-appearing MSK:  Neurovascularly intact    Limited ultrasound: Left knee:  No effusion suprapatellar pouch. Normal-appearing quadricep and patellar tendon. Normal-appearing medial and lateral meniscus. Hypoechoic change and thickening of the medial retinaculum. Mild effusion and thickening of the lateral retinaculum.  Summary: Findings most consistent with a patellar subluxation.  Ultrasound and interpretation by Clearance Coots, MD    ASSESSMENT & PLAN:   Patellar subluxation, left, initial encounter Acutely occurring.  Findings most consistent with a subluxation.  Has good endpoints with Lachman's testing and no pain with McMurray's test. -Counseled on home exercise therapy and supportive  care. -Meloxicam. -Counseled on using the crutches and brace. -Could consider physical therapy or further imaging.

## 2022-09-08 ENCOUNTER — Emergency Department (HOSPITAL_BASED_OUTPATIENT_CLINIC_OR_DEPARTMENT_OTHER)
Admission: EM | Admit: 2022-09-08 | Discharge: 2022-09-08 | Disposition: A | Discharge status: Home / Self Care | Payer: Self-pay | Attending: Emergency Medicine | Admitting: Emergency Medicine

## 2022-09-08 ENCOUNTER — Encounter (HOSPITAL_BASED_OUTPATIENT_CLINIC_OR_DEPARTMENT_OTHER): Payer: Self-pay | Admitting: Emergency Medicine

## 2022-09-08 ENCOUNTER — Emergency Department (HOSPITAL_BASED_OUTPATIENT_CLINIC_OR_DEPARTMENT_OTHER): Payer: Self-pay

## 2022-09-08 DIAGNOSIS — R059 Cough, unspecified: Secondary | ICD-10-CM | POA: Insufficient documentation

## 2022-09-08 DIAGNOSIS — M546 Pain in thoracic spine: Secondary | ICD-10-CM | POA: Insufficient documentation

## 2022-09-08 DIAGNOSIS — R0789 Other chest pain: Secondary | ICD-10-CM | POA: Insufficient documentation

## 2022-09-08 DIAGNOSIS — Z79899 Other long term (current) drug therapy: Secondary | ICD-10-CM | POA: Insufficient documentation

## 2022-09-08 MED ORDER — KETOROLAC TROMETHAMINE 30 MG/ML IJ SOLN
30.0000 mg | Freq: Once | INTRAMUSCULAR | Status: AC
  Administered 2022-09-08: 30 mg via INTRAMUSCULAR
  Filled 2022-09-08: qty 1

## 2022-09-08 NOTE — ED Triage Notes (Signed)
Pt states yesterday he started having pain in the center of his back that radiates to the front like to the center of his chest  Pt states movement makes the pain worse  Pt states he has been coughing up some phlegm  Denies N/V/D or fever  Pt states he has had some dizziness with this esp when he stands up

## 2022-09-08 NOTE — ED Provider Notes (Signed)
[facility]  Provider Note  CSN: [id number] Arrival date & time: 09/08/22 0330  History Chief Complaint  Patient presents with   Back Pain    Mario Luna is a 31 y.o. male with history of anxiety and frequent ED visits for a variety of complaints reports 2 days of mid chest and mid back pain, worse with movement and deep breath. Associated with productive cough but no fever. Has also been drinking alcohol and smoking marijuana recently. .    Home Medications Prior to Admission medications   Medication Sig Start Date End Date Taking? Authorizing Provider  meloxicam (MOBIC) 15 MG tablet One tab PO qAM with breakfast for 2 weeks, then daily prn pain. 08/21/22   Rosemarie Ax, MD  amLODipine (NORVASC) 10 MG tablet Take 1 tablet (10 mg total) by mouth daily. 07/21/19 05/04/20  Elsie Stain, MD     Allergies    Patient has no known allergies.   Review of Systems   Review of Systems Please see HPI for pertinent positives and negatives  Physical Exam BP (!) 145/93 (BP Location: Left Arm)   Pulse 86   Temp 97.6 F (36.4 C) (Oral)   Resp 16   Ht 5\' 5"  (1.651 m)   Wt 81.6 kg   SpO2 99%   BMI 29.95 kg/m   Physical Exam Vitals and nursing note reviewed.  Constitutional:      Appearance: Normal appearance.  HENT:     Head: Normocephalic and atraumatic.     Nose: Nose normal.     Mouth/Throat:     Mouth: Mucous membranes are moist.  Eyes:     Extraocular Movements: Extraocular movements intact.     Conjunctiva/sclera: Conjunctivae normal.  Cardiovascular:     Rate and Rhythm: Normal rate.  Pulmonary:     Effort: Pulmonary effort is normal.     Breath sounds: Normal breath sounds.  Chest:     Chest wall: Tenderness (midchest, reproduces symptoms) present.  Abdominal:     General: Abdomen is flat.     Palpations: Abdomen is soft.     Tenderness: There is no abdominal tenderness.  Musculoskeletal:        General: Tenderness  (diffuse thoracic back muscles, no midline tenderness) present. No swelling. Normal range of motion.     Cervical back: Neck supple.  Skin:    General: Skin is warm and dry.  Neurological:     General: No focal deficit present.     Mental Status: He is alert.  Psychiatric:        Mood and Affect: Mood normal.     ED Results / Procedures / Treatments   EKG EKG Interpretation  Date/Time:  Saturday September 08 2022 04:08:15 EDT Ventricular Rate:  79 PR Interval:  147 QRS Duration: 89 QT Interval:  359 QTC Calculation: 412 R Axis:   82 Text Interpretation: Sinus rhythm ST elevation probably early repol No significant change since last tracing Confirmed by Calvert Cantor [phone]) on 09/08/2022 4:10:46 AM  Procedures Procedures  Medications Ordered in the ED Medications  ketorolac (TORADOL) 30 MG/ML injection 30 mg (30 mg Intramuscular Given 09/08/22 0409)    Initial Impression and Plan  Patient here with chest and back pain. Low risk for ACS or PE. Vitals are unremarkable, doubt PTX, dissection, PNA or other acute/lifethreatning cause. He is very tender to palpation making MSK pain more likely. Distal pulses are normal. Will check CXR and EKG.  ED Course   Clinical Course as of 09/08/22 0432  Sat Sep 08, 2022  0429 I personally viewed the images from radiology studies and agree with radiologist interpretation: CXR is clear. Patient given Toradol for pain. Reassured his EKG and CXR are normal. Recommend he continue NSAIDs as needed. Avoid EtOH or THC use. Follow up with PCP.   [CS]    Clinical Course User Index [CS] Truddie Hidden, MD     MDM Rules/Calculators/A&P Medical Decision Making Problems Addressed: Acute bilateral thoracic back pain: acute illness or injury Atypical chest pain: acute illness or injury  Amount and/or Complexity of Data Reviewed Radiology: ordered and independent interpretation performed. ECG/medicine tests: ordered and independent  interpretation performed.  Risk Prescription drug management.    Final Clinical Impression(s) / ED Diagnoses Final diagnoses:  Acute bilateral thoracic back pain  Atypical chest pain    Rx / DC Orders ED Discharge Orders     None        Truddie Hidden, MD 09/08/22 [phone]

## 2022-10-20 ENCOUNTER — Emergency Department (HOSPITAL_BASED_OUTPATIENT_CLINIC_OR_DEPARTMENT_OTHER)
Admission: EM | Admit: 2022-10-20 | Discharge: 2022-10-20 | Disposition: A | Discharge status: Home / Self Care | Payer: No Typology Code available for payment source | Attending: Emergency Medicine | Admitting: Emergency Medicine

## 2022-10-20 ENCOUNTER — Emergency Department (HOSPITAL_BASED_OUTPATIENT_CLINIC_OR_DEPARTMENT_OTHER): Payer: No Typology Code available for payment source

## 2022-10-20 ENCOUNTER — Other Ambulatory Visit: Payer: Self-pay

## 2022-10-20 ENCOUNTER — Encounter (HOSPITAL_BASED_OUTPATIENT_CLINIC_OR_DEPARTMENT_OTHER): Payer: Self-pay | Admitting: Emergency Medicine

## 2022-10-20 DIAGNOSIS — Z23 Encounter for immunization: Secondary | ICD-10-CM | POA: Diagnosis not present

## 2022-10-20 DIAGNOSIS — Y92410 Unspecified street and highway as the place of occurrence of the external cause: Secondary | ICD-10-CM | POA: Insufficient documentation

## 2022-10-20 DIAGNOSIS — S60511A Abrasion of right hand, initial encounter: Secondary | ICD-10-CM | POA: Insufficient documentation

## 2022-10-20 MED ORDER — ONDANSETRON 4 MG PO TBDP
4.0000 mg | ORAL_TABLET | Freq: Once | ORAL | Status: AC
  Administered 2022-10-20: 4 mg via ORAL
  Filled 2022-10-20: qty 1

## 2022-10-20 MED ORDER — OXYCODONE-ACETAMINOPHEN 5-325 MG PO TABS: 1.0000 | ORAL_TABLET | Freq: Four times a day (QID) | ORAL | 0 refills | Status: DC | PRN

## 2022-10-20 MED ORDER — OXYCODONE-ACETAMINOPHEN 5-325 MG PO TABS
1.0000 | ORAL_TABLET | Freq: Once | ORAL | Status: AC
  Administered 2022-10-20: 1 via ORAL
  Filled 2022-10-20: qty 1

## 2022-10-20 MED ORDER — TETANUS-DIPHTH-ACELL PERTUSSIS 5-2.5-18.5 LF-MCG/0.5 IM SUSY
0.5000 mL | PREFILLED_SYRINGE | Freq: Once | INTRAMUSCULAR | Status: AC
  Administered 2022-10-20: 0.5 mL via INTRAMUSCULAR
  Filled 2022-10-20: qty 0.5

## 2022-10-20 MED ORDER — ONDANSETRON 8 MG PO TBDP: 8.0000 mg | ORAL_TABLET | Freq: Three times a day (TID) | ORAL | 0 refills | Status: DC | PRN
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
## 2022-10-20 NOTE — ED Notes (Signed)
Hard cervical collar placed in triage, for neck pain and not able to twist or turn either
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
## 2023-01-28 ENCOUNTER — Encounter (HOSPITAL_BASED_OUTPATIENT_CLINIC_OR_DEPARTMENT_OTHER): Payer: Self-pay | Admitting: Emergency Medicine

## 2023-01-28 ENCOUNTER — Other Ambulatory Visit: Payer: Self-pay

## 2023-01-28 ENCOUNTER — Emergency Department (HOSPITAL_BASED_OUTPATIENT_CLINIC_OR_DEPARTMENT_OTHER)
Admission: EM | Admit: 2023-01-28 | Discharge: 2023-01-28 | Disposition: A | Discharge status: Home / Self Care | Payer: Self-pay | Attending: Emergency Medicine | Admitting: Emergency Medicine

## 2023-01-28 DIAGNOSIS — N342 Other urethritis: Secondary | ICD-10-CM

## 2023-01-28 DIAGNOSIS — N341 Nonspecific urethritis: Secondary | ICD-10-CM | POA: Insufficient documentation

## 2023-01-28 MED ORDER — LIDOCAINE HCL (PF) 1 % IJ SOLN: 2.0000 mL | Freq: Once | INTRAMUSCULAR | Status: AC

## 2023-01-28 MED ORDER — DOXYCYCLINE HYCLATE 100 MG PO CAPS: 100.0000 mg | ORAL_CAPSULE | Freq: Two times a day (BID) | ORAL | 0 refills | Status: DC

## 2023-01-28 MED ORDER — CEFTRIAXONE SODIUM 500 MG IJ SOLR
500.0000 mg | Freq: Once | INTRAMUSCULAR | Status: AC
  Administered 2023-01-28: 500 mg via INTRAMUSCULAR
  Filled 2023-01-28: qty 500

## 2023-01-28 MED ORDER — DOXYCYCLINE HYCLATE 100 MG PO TABS
100.0000 mg | ORAL_TABLET | Freq: Once | ORAL | Status: AC
  Administered 2023-01-28: 100 mg via ORAL
  Filled 2023-01-28: qty 1

## 2023-01-28 MED ORDER — LIDOCAINE HCL (PF) 1 % IJ SOLN
INTRAMUSCULAR | Status: AC
  Administered 2023-01-28: 2 mL
  Filled 2023-01-28: qty 5

## 2023-01-28 NOTE — ED Provider Notes (Signed)
[place] [facility]Y DEPARTMENT [facility] Provider Note   CSN: [id number] Arrival date & time: 01/28/23  0108     History    Mario Luna is a 32 y.o. male.  The history is provided by the patient.  Penile Discharge This is a new problem. The current episode started less than 1 hour ago. The problem occurs constantly. The problem has not changed since onset.Pertinent negatives include no abdominal pain. Nothing aggravates the symptoms. Nothing relieves the symptoms. He has tried nothing for the symptoms. The treatment provided no relief.  Patient reports using a condom but having 1 hour of penile discharge.       Home Medications Prior to Admission medications   Medication Sig Start Date End Date Taking? Authorizing Provider  doxycycline (VIBRAMYCIN) 100 MG capsule Take 1 capsule (100 mg total) by mouth 2 (two) times daily. One po bid x 7 days 01/28/23  Yes Latosha Gaylord, MD  meloxicam (MOBIC) 15 MG tablet One tab PO qAM with breakfast for 2 weeks, then daily prn pain. 08/21/22   Rosemarie Ax, MD  ondansetron (ZOFRAN-ODT) 8 MG disintegrating tablet Take 1 tablet (8 mg total) by mouth every 8 (eight) hours as needed for nausea or vomiting. 10/20/22   Sherrill Raring, PA-C  oxyCODONE-acetaminophen (PERCOCET/ROXICET) 5-325 MG tablet Take 1 tablet by mouth every 6 (six) hours as needed for severe pain. 10/20/22   Sherrill Raring, PA-C  amLODipine (NORVASC) 10 MG tablet Take 1 tablet (10 mg total) by mouth daily. 07/21/19 05/04/20  Elsie Stain, MD      Allergies    Patient has no known allergies.    Review of Systems   Review of Systems  Constitutional:  Negative for fever.  Eyes:  Negative for redness.  Respiratory:  Negative for wheezing and stridor.   Gastrointestinal:  Negative for abdominal pain.  Genitourinary:  Positive for penile discharge.  All other systems reviewed and are negative.   Physical Exam Updated Vital Signs BP 127/88   Pulse 96   Temp  98.5 F (36.9 C) (Oral)   Resp 18   Ht 5\' 6"  (1.676 m)   Wt 81.6 kg   SpO2 100%   BMI 29.05 kg/m  Physical Exam Vitals and nursing note reviewed.  Constitutional:      General: He is not in acute distress.    Appearance: Normal appearance. He is well-developed. He is not diaphoretic.  HENT:     Head: Normocephalic and atraumatic.     Nose: Nose normal.  Eyes:     Conjunctiva/sclera: Conjunctivae normal.     Pupils: Pupils are equal, round, and reactive to light.  Cardiovascular:     Rate and Rhythm: Normal rate and regular rhythm.  Pulmonary:     Effort: Pulmonary effort is normal.     Breath sounds: Normal breath sounds. No wheezing or rales.  Abdominal:     General: Bowel sounds are normal.     Palpations: Abdomen is soft.     Tenderness: There is no abdominal tenderness. There is no guarding or rebound.  Musculoskeletal:        General: Normal range of motion.     Cervical back: Normal range of motion and neck supple.  Skin:    General: Skin is warm and dry.     Capillary Refill: Capillary refill takes less than 2 seconds.  Neurological:     General: No focal deficit present.     Mental Status: He is  alert and oriented to person, place, and time.     Deep Tendon Reflexes: Reflexes normal.  Psychiatric:        Mood and Affect: Mood normal.        Behavior: Behavior normal.     ED Results / Procedures / Treatments   Labs (all labs ordered are listed, but only abnormal results are displayed) Labs Reviewed  GC/CHLAMYDIA PROBE AMP ([place]) NOT AT [facility]    EKG None  Radiology No results found.  Procedures Procedures    Medications Ordered in ED Medications  cefTRIAXone (ROCEPHIN) injection 500 mg (500 mg Intramuscular Given 01/28/23 0141)  doxycycline (VIBRA-TABS) tablet 100 mg (100 mg Oral Given 01/28/23 0141)  lidocaine (PF) (XYLOCAINE) 1 % injection 2 mL (2 mLs Other Given 01/28/23 0144)    ED Course/ Medical Decision Making/ A&P                              Medical Decision Making Penile discharge x 1   Amount and/or Complexity of Data Reviewed External Data Reviewed: notes.    Details: Previous notes reviewed Labs: ordered.    Details: GC chlamydia sent   Risk Prescription drug management. Risk Details: Well appearing, normal exam and vitals.  Treatment initiated in the ED.  Stable for discharge.  All partners to be informed and treated at [facility] health department    Final Clinical Impression(s) / ED Diagnoses Final diagnoses:  Urethritis   Return for intractable cough, coughing up blood, fevers > 100.4 unrelieved by medication, shortness of breath, intractable vomiting, chest pain, shortness of breath, weakness, numbness, changes in speech, facial asymmetry, abdominal pain, passing out, Inability to tolerate liquids or food, cough, altered mental status or any concerns. No signs of systemic illness or infection. The patient is nontoxic-appearing on exam and vital signs are within normal limits.  I have reviewed the triage vital signs and the nursing notes. Pertinent labs & imaging results that were available during my care of the patient were reviewed by me and considered in my medical decision making (see chart for details). After history, exam, and medical workup I feel the patient has been appropriately medically screened and is safe for discharge home. Pertinent diagnoses were discussed with the patient. Patient was given return precautions.  Rx / DC Orders ED Discharge Orders          Ordered    doxycycline (VIBRAMYCIN) 100 MG capsule  2 times daily        01/28/23 0155              Jamisyn Langer, MD 01/28/23 0157
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
## 2023-01-29 LAB — GC/CHLAMYDIA PROBE AMP (~~LOC~~) NOT AT ARMC
Chlamydia: POSITIVE — AB
Comment: NEGATIVE
Comment: NORMAL
Neisseria Gonorrhea: NEGATIVE

## 2023-02-18 ENCOUNTER — Encounter: Payer: Self-pay | Admitting: *Deleted

## 2023-06-11 ENCOUNTER — Other Ambulatory Visit: Payer: Self-pay

## 2023-06-11 ENCOUNTER — Emergency Department (HOSPITAL_BASED_OUTPATIENT_CLINIC_OR_DEPARTMENT_OTHER)
Admission: EM | Admit: 2023-06-11 | Discharge: 2023-06-11 | Disposition: A | Discharge status: Home / Self Care | Payer: Self-pay | Attending: Emergency Medicine | Admitting: Emergency Medicine

## 2023-06-11 ENCOUNTER — Emergency Department (HOSPITAL_BASED_OUTPATIENT_CLINIC_OR_DEPARTMENT_OTHER): Payer: Self-pay

## 2023-06-11 ENCOUNTER — Encounter (HOSPITAL_BASED_OUTPATIENT_CLINIC_OR_DEPARTMENT_OTHER): Payer: Self-pay | Admitting: Emergency Medicine

## 2023-06-11 DIAGNOSIS — M79671 Pain in right foot: Secondary | ICD-10-CM | POA: Diagnosis present

## 2023-06-11 DIAGNOSIS — Y92410 Unspecified street and highway as the place of occurrence of the external cause: Secondary | ICD-10-CM | POA: Insufficient documentation

## 2023-06-11 DIAGNOSIS — S8002XA Contusion of left knee, initial encounter: Secondary | ICD-10-CM | POA: Diagnosis not present

## 2023-06-11 DIAGNOSIS — S93601A Unspecified sprain of right foot, initial encounter: Secondary | ICD-10-CM | POA: Insufficient documentation

## 2023-06-11 DIAGNOSIS — S60511A Abrasion of right hand, initial encounter: Secondary | ICD-10-CM | POA: Insufficient documentation

## 2023-06-11 DIAGNOSIS — M25571 Pain in right ankle and joints of right foot: Secondary | ICD-10-CM

## 2023-06-11 DIAGNOSIS — S60512A Abrasion of left hand, initial encounter: Secondary | ICD-10-CM | POA: Insufficient documentation

## 2023-06-11 MED ORDER — HYDROCODONE-ACETAMINOPHEN 5-325 MG PO TABS
2.0000 | ORAL_TABLET | Freq: Once | ORAL | Status: AC
  Administered 2023-06-11: 2 via ORAL
  Filled 2023-06-11: qty 2
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
## 2023-06-11 NOTE — ED Notes (Signed)
Patient transported to X-ray 

## 2023-06-11 NOTE — Discharge Instructions (Signed)
Wear Ace bandage for comfort and support.  Ice for 20 minutes every 2 hours while awake for the next 2 days.  Elevate your ankle is much as possible for the next few days.  Take ibuprofen 600 mg every 6 hours as needed for pain.  Follow-up with primary doctor if not improving in the next week.
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
## 2023-07-30 ENCOUNTER — Other Ambulatory Visit: Payer: Self-pay

## 2023-07-30 ENCOUNTER — Encounter (HOSPITAL_BASED_OUTPATIENT_CLINIC_OR_DEPARTMENT_OTHER): Payer: Self-pay

## 2023-07-30 DIAGNOSIS — Z5321 Procedure and treatment not carried out due to patient leaving prior to being seen by health care provider: Secondary | ICD-10-CM | POA: Insufficient documentation

## 2023-07-30 DIAGNOSIS — M62830 Muscle spasm of back: Secondary | ICD-10-CM | POA: Insufficient documentation

## 2023-07-30 NOTE — ED Triage Notes (Signed)
Pt c/o back pain, no injury As the day progressed the pain has worsened and is now having spasms in mid upper back. Motrin 800mg  PTA

## 2023-07-31 ENCOUNTER — Emergency Department (HOSPITAL_BASED_OUTPATIENT_CLINIC_OR_DEPARTMENT_OTHER)
Admission: EM | Admit: 2023-07-31 | Discharge: 2023-07-31 | Discharge status: Against Medical Advice | Payer: Self-pay | Attending: Emergency Medicine | Admitting: Emergency Medicine

## 2023-10-15 ENCOUNTER — Emergency Department (HOSPITAL_BASED_OUTPATIENT_CLINIC_OR_DEPARTMENT_OTHER): Payer: Self-pay

## 2023-10-15 ENCOUNTER — Emergency Department (HOSPITAL_BASED_OUTPATIENT_CLINIC_OR_DEPARTMENT_OTHER)
Admission: EM | Admit: 2023-10-15 | Discharge: 2023-10-15 | Disposition: A | Discharge status: Home / Self Care | Payer: Self-pay | Attending: Emergency Medicine | Admitting: Emergency Medicine

## 2023-10-15 ENCOUNTER — Encounter (HOSPITAL_BASED_OUTPATIENT_CLINIC_OR_DEPARTMENT_OTHER): Payer: Self-pay | Admitting: Emergency Medicine

## 2023-10-15 ENCOUNTER — Other Ambulatory Visit: Payer: Self-pay

## 2023-10-15 DIAGNOSIS — M79605 Pain in left leg: Secondary | ICD-10-CM | POA: Insufficient documentation

## 2023-10-15 MED ORDER — MELOXICAM 15 MG PO TABS: 15.0000 mg | ORAL_TABLET | Freq: Every day | ORAL | 0 refills | Status: AC

## 2023-10-15 MED ORDER — METHOCARBAMOL 500 MG PO TABS: 1000.0000 mg | ORAL_TABLET | Freq: Three times a day (TID) | ORAL | 0 refills | Status: DC | PRN

## 2023-10-15 MED ORDER — KETOROLAC TROMETHAMINE 15 MG/ML IJ SOLN
15.0000 mg | Freq: Once | INTRAMUSCULAR | Status: AC
  Administered 2023-10-15: 15 mg via INTRAMUSCULAR
  Filled 2023-10-15: qty 1

## 2023-10-15 NOTE — ED Triage Notes (Signed)
Left leg pain from thigh to lower leg , worse when sitting . Also reports swelling along the leg . No Hx DVT .  Denies shortness of breath or chest pain .

## 2023-10-15 NOTE — ED Notes (Signed)
Discharge instructions reviewed with patient. Patient verbalizes understanding, no further questions at this time. Medications/prescriptions and follow up information provided. No acute distress noted at time of departure.  

## 2023-10-15 NOTE — Discharge Instructions (Signed)
Please read and follow all provided instructions.  Your diagnoses today include:  1. Left leg pain    Tests performed today include: Vital signs - see below for your results today Ultrasound of your leg did not show any sign of a blood clot or other problems  Medications prescribed:  Meloxicam - anti-inflammatory pain medication  You have been prescribed an anti-inflammatory medication or NSAID. Take with food. Do not take aspirin, ibuprofen, or naproxen if taking this medication. Take smallest effective dose for the shortest duration needed for your pain. Stop taking if you experience stomach pain or vomiting.   Robaxin (methocarbamol) - muscle relaxer medication  DO NOT drive or perform any activities that require you to be awake and alert because this medicine can make you drowsy.   Take any prescribed medications only as directed.  Home care instructions:  Follow any educational materials contained in this packet Please rest, use ice or heat on your back for the next several days Do not lift, push, pull anything more than 10 pounds for the next week  Follow-up instructions: Please follow-up with your primary care provider or the sports medicine referral in the next 1 week for further evaluation of your symptoms.   Return instructions:  SEEK IMMEDIATE MEDICAL ATTENTION IF YOU HAVE: New numbness, tingling, weakness, or problem with the use of your arms or legs Severe back pain not relieved with medications Loss control of your bowels or bladder Increasing pain in any areas of the body (such as chest or abdominal pain) Shortness of breath, dizziness, or fainting.  Worsening nausea (feeling sick to your stomach), vomiting, fever, or sweats Any other emergent concerns regarding your health   Additional Information:  Your vital signs today were: BP 122/74   Pulse 81   Temp 97.7 F (36.5 C) (Oral)   Resp 16   Wt 86.2 kg   SpO2 97%   BMI 30.67 kg/m  If your blood  pressure (BP) was elevated above 135/85 this visit, please have this repeated by your doctor within one month. --------------

## 2023-10-15 NOTE — ED Provider Notes (Signed)
[place] [facility] Provider Note   CSN: [id number] Arrival date & time: 10/15/23  1018     History  Chief Complaint  Patient presents with   Leg Pain    Mario Luna is a 32 y.o. male.  Patient with no history of back problems presents to the emergency department today for evaluation of pain going down his left leg.  Pain started acutely yesterday without any injury.  He does report walking a lot yesterday.  He developed a throbbing type of pain in the left thigh down to the ankle.  No weakness.  Today the pain runs from the knee down to the ankle.  He had feels subjectively swollen in the knee and lower leg area.  Denies any redness.  No fevers.  He took ibuprofen without much improvement.  No history of surgeries on the leg or of the lower back.  Patient denies risk factors for DVT/pulmonary embolism including:  history of DVT/PE/other blood clots, use of exogenous hormones, recent immobilizations, recent surgery, recent travel (>4hr segment), malignancy, hemoptysis.          Home Medications Prior to Admission medications   Medication Sig Start Date End Date Taking? Authorizing Provider  doxycycline (VIBRAMYCIN) 100 MG capsule Take 1 capsule (100 mg total) by mouth 2 (two) times daily. One po bid x 7 days 01/28/23   Palumbo, April, MD  meloxicam (MOBIC) 15 MG tablet One tab PO qAM with breakfast for 2 weeks, then daily prn pain. 08/21/22   Myra Rude, MD  ondansetron (ZOFRAN-ODT) 8 MG disintegrating tablet Take 1 tablet (8 mg total) by mouth every 8 (eight) hours as needed for nausea or vomiting. 10/20/22   Theron Arista, PA-C  oxyCODONE-acetaminophen (PERCOCET/ROXICET) 5-325 MG tablet Take 1 tablet by mouth every 6 (six) hours as needed for severe pain. 10/20/22   Theron Arista, PA-C  amLODipine (NORVASC) 10 MG tablet Take 1 tablet (10 mg total) by mouth daily. 07/21/19 05/04/20  Storm Frisk, MD      Allergies    Patient has no  known allergies.    Review of Systems   Review of Systems  Physical Exam Updated Vital Signs BP 122/74   Pulse 81   Temp 97.7 F (36.5 C) (Oral)   Resp 16   Wt 86.2 kg   SpO2 97%   BMI 30.67 kg/m   Physical Exam Vitals and nursing note reviewed.  Constitutional:      Appearance: He is well-developed.  HENT:     Head: Normocephalic and atraumatic.  Eyes:     Conjunctiva/sclera: Conjunctivae normal.  Cardiovascular:     Pulses:          Dorsalis pedis pulses are 2+ on the right side and 2+ on the left side.  Pulmonary:     Effort: No respiratory distress.  Musculoskeletal:     Cervical back: Normal range of motion and neck supple.     Left upper leg: No swelling or tenderness.     Left knee: No bony tenderness. Normal range of motion. No tenderness.     Right lower leg: No edema.     Left lower leg: Tenderness present. No swelling. No edema.     Left ankle: No tenderness. Normal range of motion.     Comments: I am able to passively range the patient's ankle, knee and hip.  He reports discomfort in the leg that has not really changed with movement.  No strength  deficits.  Skin:    General: Skin is warm and dry.  Neurological:     Mental Status: He is alert.     ED Results / Procedures / Treatments   Labs (all labs ordered are listed, but only abnormal results are displayed) Labs Reviewed - No data to display  EKG None  Radiology US Venous Img Lower  Left (DVT Study)  Result Date: 10/15/2023 CLINICAL DATA:  Left leg pain between knee and ankle Subjective swelling EXAM: LEFT LOWER EXTREMITY VENOUS DOPPLER ULTRASOUND TECHNIQUE: Gray-scale sonography with compression, as well as color and duplex ultrasound, were performed to evaluate the deep venous system(s) from the level of the common femoral vein through the popliteal and proximal calf veins. COMPARISON:  None available FINDINGS: VENOUS Normal compressibility of the common femoral, superficial femoral, and  popliteal veins, as well as the visualized calf veins. Visualized portions of profunda femoral vein and great saphenous vein unremarkable. No filling defects to suggest DVT on grayscale or color Doppler imaging. Doppler waveforms show normal direction of venous flow, normal respiratory plasticity and response to augmentation. Limited views of the contralateral common femoral vein are unremarkable. OTHER None. Limitations: none IMPRESSION: No DVT of the left lower extremity. Electronically Signed   By: Acquanetta Belling M.D.   On: 10/15/2023 11:53    Procedures Procedures    Medications Ordered in ED Medications  ketorolac (TORADOL) 15 MG/ML injection 15 mg (has no administration in time range)    ED Course/ Medical Decision Making/ A&P    Patient seen and examined. History obtained directly from patient.   Labs/EKG: None ordered  Imaging: Ordered left lower extremity DVT study.  Medications/Fluids: IM Toradol  Most recent vital signs reviewed and are as follows: BP 122/74   Pulse 81   Temp 97.7 F (36.5 C) (Oral)   Resp 16   Wt 86.2 kg   SpO2 97%   BMI 30.67 kg/m   Initial impression: Left lower extremity pain, throbbing in nature.  No obvious swelling on exam.  Low concern for septic arthritis in the ankle or knee.  12:13 PM Reassessment performed. Patient appears comfortable, resting.  Imaging results reviewed including: Lower extremity ultrasound, negative for DVT.  Reviewed pertinent lab work and imaging with patient at bedside. Questions answered.   Most current vital signs reviewed and are as follows: BP 122/74   Pulse 81   Temp 97.7 F (36.5 C) (Oral)   Resp 16   Wt 86.2 kg   SpO2 97%   BMI 30.67 kg/m   Plan: Discharge to home.   Prescriptions written for: Trial of meloxicam and Robaxin  Patient counseled on proper use of muscle relaxant medication.  They were told not to drink alcohol, drive any vehicle, or do any dangerous activities while taking this  medication.  Patient verbalized understanding.  Other home care instructions discussed: Rest, heat  ED return instructions discussed: Worsening swelling, inability to bear weight, specific joint inflammation or swelling, new or worsening symptoms.  Follow-up instructions discussed: Patient encouraged to follow-up with their PCP in 7 days.                                   Medical Decision Making Risk Prescription drug management.   Patient with shooting and throbbing pain in the left lower extremity.  He has normal pedal pulses.  DVT study negative.  Patient does not have focal swelling in  the knee or ankle to suggest effusion or inflammatory arthritis.  Pain seems more generalized.  Shooting nature may suggest a radicular type of pain.  No focal low back pain or other red flags.  Do not feel that patient requires advanced imaging of his lumbar spine today.  The patient's vital signs, pertinent lab work and imaging were reviewed and interpreted as discussed in the ED course. Hospitalization was considered for further testing, treatments, or serial exams/observation. However as patient is well-appearing, has a stable exam, and reassuring studies today, I do not feel that they warrant admission at this time. This plan was discussed with the patient who verbalizes agreement and comfort with this plan and seems reliable and able to return to the Emergency Department with worsening or changing symptoms.          Final Clinical Impression(s) / ED Diagnoses Final diagnoses:  Left leg pain    Rx / DC Orders ED Discharge Orders          Ordered    meloxicam (MOBIC) 15 MG tablet  Daily        10/15/23 1206    methocarbamol (ROBAXIN) 500 MG tablet  Every 8 hours PRN        10/15/23 1206              Renne Crigler, PA-C 10/15/23 1215    Ernie Avena, MD 10/15/23 1443

## 2024-03-10 ENCOUNTER — Emergency Department (HOSPITAL_BASED_OUTPATIENT_CLINIC_OR_DEPARTMENT_OTHER)
Admission: EM | Admit: 2024-03-10 | Discharge: 2024-03-10 | Disposition: A | Discharge status: Home / Self Care | Payer: Self-pay

## 2024-03-10 ENCOUNTER — Other Ambulatory Visit: Payer: Self-pay

## 2024-03-10 ENCOUNTER — Encounter (HOSPITAL_BASED_OUTPATIENT_CLINIC_OR_DEPARTMENT_OTHER): Payer: Self-pay | Admitting: Emergency Medicine

## 2024-03-10 DIAGNOSIS — R369 Urethral discharge, unspecified: Secondary | ICD-10-CM | POA: Insufficient documentation

## 2024-03-10 LAB — URINALYSIS, ROUTINE W REFLEX MICROSCOPIC
Bilirubin Urine: NEGATIVE
Glucose, UA: NEGATIVE mg/dL
Hgb urine dipstick: NEGATIVE
Ketones, ur: NEGATIVE mg/dL
Nitrite: NEGATIVE
Protein, ur: 30 mg/dL — AB
Specific Gravity, Urine: >=1.03 (ref 1.005–1.030)
pH: 6 (ref 5.0–8.0)

## 2024-03-10 LAB — URINALYSIS, MICROSCOPIC (REFLEX)

## 2024-03-10 LAB — HIV ANTIBODY (ROUTINE TESTING W REFLEX): HIV Screen 4th Generation wRfx: NONREACTIVE

## 2024-03-10 MED ORDER — CEFTRIAXONE SODIUM 500 MG IJ SOLR
500.0000 mg | Freq: Once | INTRAMUSCULAR | Status: AC
  Administered 2024-03-10: 500 mg via INTRAMUSCULAR
  Filled 2024-03-10: qty 500

## 2024-03-10 MED ORDER — LIDOCAINE HCL (PF) 1 % IJ SOLN
INTRAMUSCULAR | Status: AC
  Administered 2024-03-10: 1 mL via INTRAMUSCULAR
  Filled 2024-03-10: qty 5

## 2024-03-10 MED ORDER — AZITHROMYCIN 250 MG PO TABS
1000.0000 mg | ORAL_TABLET | Freq: Once | ORAL | Status: AC
  Administered 2024-03-10: 1000 mg via ORAL
  Filled 2024-03-10: qty 4

## 2024-03-10 NOTE — Discharge Instructions (Addendum)
 Today you were seen for penile discharge.  1 or more your tests are still pending and you will be notified of these results.  You have been treated for gonorrhea and chlamydia prophylactically today.  Please return to the ED if your symptoms persist for further evaluation and workup.  Thank you for letting us  treat you today. After reviewing your labs, I feel you are safe to go home. Please follow up with your PCP in the next several days and provide them with your records from this visit. Return to the Emergency Room if pain becomes severe or symptoms worsen.

## 2024-03-10 NOTE — ED Provider Notes (Signed)
 [place] [facility] Provider Note   CSN: [id number] Arrival date & time: 03/10/24  1443     History  Chief Complaint  Patient presents with   Penile Discharge    Mario Luna is a 33 y.o. male presents today for penile discharge that began today.  Patient denies dysuria, hematuria, frequency, or urgency.  Patient also denies fever, chills, or any other complaints at this time.   Penile Discharge       Home Medications Prior to Admission medications   Medication Sig Start Date End Date Taking? Authorizing Provider  meloxicam  (MOBIC ) 15 MG tablet Take 1 tablet (15 mg total) by mouth daily. 10/15/23   Geiple, Joshua, PA-C  methocarbamol  (ROBAXIN ) 500 MG tablet Take 2 tablets (1,000 mg total) by mouth every 8 (eight) hours as needed for muscle spasms. 10/15/23   Lyna Sandhoff, PA-C  amLODipine  (NORVASC ) 10 MG tablet Take 1 tablet (10 mg total) by mouth daily. 07/21/19 05/04/20  Vernell Goldsmith, MD      Allergies    Patient has no known allergies.    Review of Systems   Review of Systems  Genitourinary:  Positive for penile discharge.    Physical Exam Updated Vital Signs BP 129/83 (BP Location: Left Arm)   Pulse (!) 102   Temp 98.1 F (36.7 C) (Oral)   Resp 18   Ht 5\' 6"  (1.676 m)   Wt 86.2 kg   SpO2 100%   BMI 30.67 kg/m  Physical Exam Vitals and nursing note reviewed.  Constitutional:      General: He is not in acute distress.    Appearance: Normal appearance. He is well-developed. He is not ill-appearing, toxic-appearing or diaphoretic.  HENT:     Head: Normocephalic and atraumatic.     Nose: Nose normal.  Eyes:     Extraocular Movements: Extraocular movements intact.     Conjunctiva/sclera: Conjunctivae normal.  Cardiovascular:     Rate and Rhythm: Normal rate and regular rhythm.     Pulses: Normal pulses.     Heart sounds: Normal heart sounds. No murmur heard. Pulmonary:     Effort: Pulmonary effort is normal.  No respiratory distress.     Breath sounds: Normal breath sounds.  Abdominal:     Palpations: Abdomen is soft.     Tenderness: There is no abdominal tenderness.  Musculoskeletal:        General: No swelling.     Cervical back: Neck supple.  Skin:    General: Skin is warm and dry.     Capillary Refill: Capillary refill takes less than 2 seconds.  Neurological:     General: No focal deficit present.     Mental Status: He is alert.  Psychiatric:        Mood and Affect: Mood normal.     ED Results / Procedures / Treatments   Labs (all labs ordered are listed, but only abnormal results are displayed) Labs Reviewed  URINALYSIS, ROUTINE W REFLEX MICROSCOPIC - Abnormal; Notable for the following components:      Result Value   Protein, ur 30 (*)    Leukocytes,Ua TRACE (*)    All other components within normal limits  URINALYSIS, MICROSCOPIC (REFLEX) - Abnormal; Notable for the following components:   Bacteria, UA RARE (*)    All other components within normal limits  HIV ANTIBODY (ROUTINE TESTING W REFLEX)  RPR  GC/CHLAMYDIA PROBE AMP ([place]) NOT AT [facility]  EKG None  Radiology No results found.  Procedures Procedures    Medications Ordered in ED Medications  cefTRIAXone  (ROCEPHIN ) injection 500 mg (has no administration in time range)  azithromycin  (ZITHROMAX ) tablet 1,000 mg (has no administration in time range)  lidocaine  (PF) (XYLOCAINE ) 1 % injection (has no administration in time range)    ED Course/ Medical Decision Making/ A&P                                 Medical Decision Making Amount and/or Complexity of Data Reviewed Labs: ordered.   This patient presents to the ED for concern of penile discharge differential diagnosis includes gonorrhea, chlamydia, UTI, trichomonas   Lab Tests:  I Ordered, and personally interpreted labs.  The pertinent results include: UA with trace leukocytes, 30 protein, rare bacteria, 11-20 WBCs  Medicines ordered  and prescription drug management:  I ordered medication including Rocephin  and azithromycin  for gonorrhea and chlamydia prophylaxis Reevaluation of the patient after these medicines showed that the patient stayed the same I have reviewed the patients home medicines and have made adjustments as needed   Problem List / ED Course:  Considered for admission or further workup however patient's vital signs, physical exam, and labs are reassuring.  Patient prophylactically treated for gonorrhea and chlamydia.  Patient still has 1 or more labs pending and will be notified of these results and if they require treatment.  I feel patient safe for discharge at this time.           Final Clinical Impression(s) / ED Diagnoses Final diagnoses:  Penile discharge    Rx / DC Orders ED Discharge Orders     None         Carie Charity, PA-C 03/10/24 1808    Carin Charleston, MD 03/10/24 [phone]

## 2024-03-10 NOTE — ED Triage Notes (Signed)
 Pt c/o penile discharge that started today; denies pain or urinary sxs

## 2024-03-10 NOTE — ED Notes (Signed)
 Pt denys ABD pain; states he just needs STD testing.

## 2024-03-11 LAB — GC/CHLAMYDIA PROBE AMP (~~LOC~~) NOT AT ARMC
Chlamydia: NEGATIVE
Comment: NEGATIVE
Comment: NORMAL
Neisseria Gonorrhea: POSITIVE — AB

## 2024-03-11 LAB — RPR: RPR Ser Ql: NONREACTIVE

## 2024-03-23 ENCOUNTER — Other Ambulatory Visit (HOSPITAL_COMMUNITY): Payer: Self-pay

## 2024-06-24 ENCOUNTER — Emergency Department (HOSPITAL_BASED_OUTPATIENT_CLINIC_OR_DEPARTMENT_OTHER): Payer: Self-pay

## 2024-06-24 ENCOUNTER — Other Ambulatory Visit: Payer: Self-pay

## 2024-06-24 ENCOUNTER — Encounter (HOSPITAL_BASED_OUTPATIENT_CLINIC_OR_DEPARTMENT_OTHER): Payer: Self-pay

## 2024-06-24 ENCOUNTER — Emergency Department (HOSPITAL_BASED_OUTPATIENT_CLINIC_OR_DEPARTMENT_OTHER)
Admission: EM | Admit: 2024-06-24 | Discharge: 2024-06-24 | Disposition: A | Discharge status: Home / Self Care | Payer: Self-pay | Attending: Emergency Medicine | Admitting: Emergency Medicine

## 2024-06-24 DIAGNOSIS — R0789 Other chest pain: Secondary | ICD-10-CM | POA: Insufficient documentation

## 2024-06-24 DIAGNOSIS — M546 Pain in thoracic spine: Secondary | ICD-10-CM | POA: Insufficient documentation

## 2024-06-24 MED ORDER — METHOCARBAMOL 500 MG PO TABS: 1000.0000 mg | ORAL_TABLET | Freq: Three times a day (TID) | ORAL | 0 refills | Status: AC | PRN

## 2024-06-24 MED ORDER — KETOROLAC TROMETHAMINE 15 MG/ML IJ SOLN
15.0000 mg | Freq: Once | INTRAMUSCULAR | Status: AC
  Administered 2024-06-24: 15 mg via INTRAMUSCULAR
  Filled 2024-06-24: qty 1

## 2024-06-24 NOTE — Discharge Instructions (Addendum)
 You were evaluated in the emergency room for chest and back pain your EKG and chest x-ray did not show any significant abnormality.  A prescription for Robaxin , muscle relaxer was sent into your pharmacy.  Please avoid driving or drinking alcohol while using this medication as it may cause drowsiness.  Additionally recommend alternating Tylenol  1000 mg every 4-6 hours and/or Motrin  600 to 800 mg every 4-6 hours on a full stomach.  If your symptoms persist please call the number on the sheet to follow-up with orthopedics.

## 2024-06-24 NOTE — ED Notes (Signed)
 Pt alert and oriented X 4 at the time of discharge. RR even and unlabored. No acute distress noted. Pt verbalized understanding of discharge instructions as discussed. Pt ambulatory to lobby at time of discharge.

## 2024-06-24 NOTE — ED Provider Notes (Signed)
 [place] [facility] Provider Note   CSN: [id number] Arrival date & time: 06/24/24  1031     Patient presents with: Back Pain and Chest Pain   Mario Luna is a 33 y.o. male with history of palpitations presents with complaints of mid back pain that started yesterday.  States he was recently at work when the pain started.  He is unsure of any injury or trauma.  Not associated with any radicular symptoms. His pain is worse with any movement. Today started having tightness and pain that he states radiates to his chest.  Not associated with any shortness of breath, dizziness or nausea.      Back Pain Associated symptoms: chest pain   Chest Pain Associated symptoms: back pain       Past Medical History:  Diagnosis Date  . Anxiety   . Atrial fibrillation (HCC) 05/19/2015  . Heart murmur    Past Surgical History:  Procedure Laterality Date  . ADENOIDECTOMY    . TONSILLECTOMY       Prior to Admission medications   Medication Sig Start Date End Date Taking? Authorizing Provider  meloxicam  (MOBIC ) 15 MG tablet Take 1 tablet (15 mg total) by mouth daily. 10/15/23   Geiple, Joshua, PA-C  methocarbamol  (ROBAXIN ) 500 MG tablet Take 2 tablets (1,000 mg total) by mouth every 8 (eight) hours as needed for muscle spasms. 10/15/23   Desiderio Chew, PA-C  amLODipine  (NORVASC ) 10 MG tablet Take 1 tablet (10 mg total) by mouth daily. 07/21/19 05/04/20  Brien Belvie BRAVO, MD    Allergies: Patient has no known allergies.    Review of Systems  Cardiovascular:  Positive for chest pain.  Musculoskeletal:  Positive for back pain.    Updated Vital Signs BP (!) 145/84   Pulse 91   Temp 98 F (36.7 C) (Oral)   Resp 19   SpO2 98%   Physical Exam Vitals and nursing note reviewed.  Constitutional:      General: He is not in acute distress.    Appearance: He is well-developed.  HENT:     Head: Normocephalic and atraumatic.  Eyes:      Conjunctiva/sclera: Conjunctivae normal.  Cardiovascular:     Rate and Rhythm: Normal rate and regular rhythm.     Heart sounds: No murmur heard. Pulmonary:     Effort: Pulmonary effort is normal. No respiratory distress.     Breath sounds: Normal breath sounds.  Abdominal:     Palpations: Abdomen is soft.     Tenderness: There is no abdominal tenderness.  Musculoskeletal:        General: No swelling.     Cervical back: Neck supple.     Comments: Diffuse thoracic tenderness, no gross deformities, 5 out of 5 upper and lower extremity strength, no sensation deficits, ambulates without difficulty  Skin:    General: Skin is warm and dry.     Capillary Refill: Capillary refill takes less than 2 seconds.  Neurological:     Mental Status: He is alert.  Psychiatric:        Mood and Affect: Mood normal.     (all labs ordered are listed, but only abnormal results are displayed) Labs Reviewed - No data to display  EKG: EKG Interpretation Date/Time:  Wednesday June 24 2024 10:47:19 EDT Ventricular Rate:  78 PR Interval:  150 QRS Duration:  97 QT Interval:  363 QTC Calculation: 414 R Axis:   87  Text Interpretation: Sinus  rhythm No significant change since last tracing Confirmed by Emil Share [phone]) on 06/24/2024 11:32:28 AM  Radiology: DG Chest Portable 1 View Result Date: 06/24/2024 CLINICAL DATA:  Chest pain since yesterday. EXAM: PORTABLE CHEST 1 VIEW COMPARISON:  September 08, 2022. FINDINGS: The heart size and mediastinal contours are within normal limits. Both lungs are clear. The visualized skeletal structures are unremarkable. IMPRESSION: No active disease. Electronically Signed   By: Lynwood Landy Raddle M.D.   On: 06/24/2024 11:17     Procedures   Medications Ordered in the ED  ketorolac  (TORADOL ) 15 MG/ML injection 15 mg (15 mg Intramuscular Given 06/24/24 1113)    Clinical Course as of 06/24/24 1136  Wed Jun 24, 2024  1106 Patient evaluated with complaints of atraumatic  back pain that radiates to his chest.  He is hemodynamically stable.  His exam is notable for diffuse thoracic tenderness.  He has no gross weakness or sensation deficits. He is able to ambulate. No red flag symptoms, overall do not feel that any advanced imaging or urgent neuro surgery evaluation is indicated.   Will order chest x-ray.  EKG performed in triage demonstrates normal sinus rhythm.   [JT]  1127 DG Chest Portable 1 View Chest x-ray without any acute cardiopulmonary disease. [JT]  1127 Overall exam and history most consistent with musculoskeletal etiology we will provide a shot of Toradol  here in the ED and discharged home on muscle relaxants with ortho referral.  Sedation warnings provided and instructed on strict return precautions. [JT]    Clinical Course User Index [JT] Donnajean Lynwood DEL, PA-C                                 Medical Decision Making  This patient presents to the ED with chief complaint(s) of Back pain.  The complaint involves an extensive differential diagnosis and also carries with it a high risk of complications and morbidity.   Pertinent past medical history as listed in HPI  The differential diagnosis includes  Do not suspect ACS as patient has no EKG ischemic changes, his symptoms are nonexertional and he has no significant risk factors.  He is PERC negative.  Doubt PE.  Do not suspect aortic dissection as he is sitting comfortably, normotensive, with symmetric pulses and no neurodeficits.  No mediastinal widening noted on chest x-ray.  Additional history obtained: Records reviewed Care Everywhere/External Records  Disposition:   Patient will be discharged home. The patient has been appropriately medically screened and/or stabilized in the ED. I have low suspicion for any other emergent medical condition which would require further screening, evaluation or treatment in the ED or require inpatient management. At time of discharge the patient is hemodynamically  stable and in no acute distress. I have discussed work-up results and diagnosis with patient and answered all questions. Patient is agreeable with discharge plan. We discussed strict return precautions for returning to the emergency department and they verbalized understanding.  He has no urinary incontinence or saddle anesthesia to suggest cauda equina.  No risk factors for epidural abscess.   Social Determinants of Health:   none  This note was dictated with voice recognition software.  Despite best efforts at proofreading, errors may have occurred which can change the documentation meaning.       Final diagnoses:  Acute bilateral thoracic back pain  Atypical chest pain    ED Discharge Orders     None  Donnajean Lynwood DEL, PA-C 06/24/24 1136    Emil Share, DO 06/24/24 1226

## 2024-06-24 NOTE — ED Triage Notes (Signed)
 Pt reports upper back pain that radiates to his chest that started yesterday. Denies injuries, SOB. Endorses nausea.

## 2024-08-25 ENCOUNTER — Other Ambulatory Visit: Payer: Self-pay

## 2024-08-25 ENCOUNTER — Encounter (HOSPITAL_BASED_OUTPATIENT_CLINIC_OR_DEPARTMENT_OTHER): Payer: Self-pay

## 2024-08-25 ENCOUNTER — Emergency Department (HOSPITAL_BASED_OUTPATIENT_CLINIC_OR_DEPARTMENT_OTHER)
Admission: EM | Admit: 2024-08-25 | Discharge: 2024-08-25 | Disposition: A | Discharge status: Home / Self Care | Payer: Self-pay | Attending: Emergency Medicine | Admitting: Emergency Medicine

## 2024-08-25 DIAGNOSIS — Z79899 Other long term (current) drug therapy: Secondary | ICD-10-CM | POA: Insufficient documentation

## 2024-08-25 DIAGNOSIS — Z202 Contact with and (suspected) exposure to infections with a predominantly sexual mode of transmission: Secondary | ICD-10-CM | POA: Insufficient documentation

## 2024-08-25 DIAGNOSIS — F17200 Nicotine dependence, unspecified, uncomplicated: Secondary | ICD-10-CM | POA: Insufficient documentation

## 2024-08-25 DIAGNOSIS — I10 Essential (primary) hypertension: Secondary | ICD-10-CM | POA: Insufficient documentation

## 2024-08-25 MED ORDER — DOXYCYCLINE HYCLATE 100 MG PO CAPS: 100.0000 mg | ORAL_CAPSULE | Freq: Two times a day (BID) | ORAL | 0 refills | Status: AC

## 2024-08-25 MED ORDER — CEFTRIAXONE SODIUM 500 MG IJ SOLR
500.0000 mg | Freq: Once | INTRAMUSCULAR | Status: AC
  Administered 2024-08-25: 500 mg via INTRAMUSCULAR
  Filled 2024-08-25: qty 500

## 2024-08-25 NOTE — ED Triage Notes (Addendum)
 Pt states he noticed penile discharge today No pain  Discharge has no color Requesting to be checked for STDs

## 2024-08-25 NOTE — Discharge Instructions (Signed)
 Thank you for letting us  evaluate you today.  We have given you a shot for gonorrhea.  We have tested you for HIV, syphilis, gonorrhea and chlamydia.  These test will be available within the next 24 hours.  Please pick up doxycycline  if you test positive for chlamydia although I recommend you taking it tomorrow regardless for symptoms.  We will call you if you test positive or you can check on mychart  Refrain from intercourse until you complete regimen as you may infect someone else or reinfect yourself

## 2024-08-25 NOTE — ED Provider Notes (Signed)
 [facility] Provider Note   CSN: [id number] Arrival date & time: 08/25/24  2149     Patient presents with: Penile Discharge   Mario Luna is a 33 y.o. male with past ministry of tobacco use, heart murmur, HTN presents Emergency Department for evaluation of possible exposure to STD and penile discharge.  Discharge started today following unprotected intercourse this week.  No testicular swelling or pain.  Denies fevers, urinary symptoms    Penile Discharge       Prior to Admission medications   Medication Sig Start Date End Date Taking? Authorizing Provider  doxycycline  (VIBRAMYCIN ) 100 MG capsule Take 1 capsule (100 mg total) by mouth 2 (two) times daily for 7 days. 08/25/24 09/01/24 Yes Minnie Tinnie BRAVO, PA  meloxicam  (MOBIC ) 15 MG tablet Take 1 tablet (15 mg total) by mouth daily. 10/15/23   Geiple, Joshua, PA-C  methocarbamol  (ROBAXIN ) 500 MG tablet Take 2 tablets (1,000 mg total) by mouth every 8 (eight) hours as needed for muscle spasms. 06/24/24   Donnajean Lynwood DEL, PA-C  amLODipine  (NORVASC ) 10 MG tablet Take 1 tablet (10 mg total) by mouth daily. 07/21/19 05/04/20  Brien Belvie BRAVO, MD    Allergies: Patient has no known allergies.    Review of Systems  Genitourinary:  Positive for penile discharge.    Updated Vital Signs BP (!) 138/96 (BP Location: Right Arm)   Pulse 82   Temp 97.7 F (36.5 C)   Resp 16   Ht 5' 6 (1.676 m)   Wt 83.9 kg   SpO2 100%   BMI 29.86 kg/m   Physical Exam Vitals and nursing note reviewed.  Constitutional:      General: He is not in acute distress.    Appearance: Normal appearance.  HENT:     Head: Normocephalic and atraumatic.  Eyes:     Conjunctiva/sclera: Conjunctivae normal.  Cardiovascular:     Rate and Rhythm: Normal rate.     Pulses: Normal pulses.  Pulmonary:     Effort: Pulmonary effort is normal. No respiratory distress.     Breath sounds: Normal breath sounds.   Abdominal:     General: Bowel sounds are normal. There is no distension.     Palpations: Abdomen is soft.     Tenderness: There is no abdominal tenderness. There is no guarding or rebound.  Skin:    Coloration: Skin is not jaundiced or pale.  Neurological:     Mental Status: He is alert. Mental status is at baseline.   Patient refuses GU exam  (all labs ordered are listed, but only abnormal results are displayed) Labs Reviewed  RPR  HIV ANTIBODY (ROUTINE TESTING W REFLEX)  GC/CHLAMYDIA PROBE AMP ([place]) NOT AT [facility]    EKG: None  Radiology: No results found.   Medications Ordered in the ED  cefTRIAXone  (ROCEPHIN ) injection 500 mg (500 mg Intramuscular Given 08/25/24 2339)                                    Medical Decision Making Amount and/or Complexity of Data Reviewed Labs: ordered.  Risk Prescription drug management.   Patient presents to the ED for concern of penile discharge, this involves an extensive number of treatment options, and is a complaint that carries with it a high risk of complications and morbidity.  The differential diagnosis includes gonorrhea, chlamydia, STD, UTI  Co morbidities that complicate the patient evaluation  None   Additional history obtained:  Additional history obtained from Nursing   External records from outside source obtained and reviewed including triage RN note   Lab Tests:  I Ordered, and personally interpreted labs.  The pertinent results include:   STD panel pending    Medicines ordered and prescription drug management:  I ordered medication including Rocephin , Doxy for penile discharge, STD exposure Reevaluation of the patient after these medicines showed that the patient stayed the same I have reviewed the patients home medicines and have made adjustments as needed    Problem List / ED Course:  Penile discharge Defers GU exam Denies urinary symptoms, flank pain.  Low suspicion for  UTI Discussed that STD testing will result within the next 24 hours.  As he is having symptoms of penile discharge now, we will empirically treat for STD.  Provided Rocephin  shot here Emergency Department and sent prescription for doxycycline  No abdominal pain, N/V Vital signs hemodynamically stable with no fever no tachycardia Discussed refraining from intercourse until he is able to finish antibiotics   Reevaluation:  After the interventions noted above, I reevaluated the patient and found that they have :stayed the same    Dispostion:  After consideration of the diagnostic results and the patients response to treatment, I feel that the patent would benefit from outpatient management symptomatic treatment.   Discussed ED workup, disposition, return to ED precautions with patient who expresses understanding agrees with plan.  All questions answered to their satisfaction.  They are agreeable to plan.  Discharge instructions provided on paperwork  Final diagnoses:  Possible exposure to STD    ED Discharge Orders          Ordered    doxycycline  (VIBRAMYCIN ) 100 MG capsule  2 times daily        08/25/24 2326               Minnie Tinnie BRAVO, PA 08/25/24 2354    Jerral Meth, MD 08/26/24 [phone]

## 2024-08-25 NOTE — ED Provider Notes (Incomplete)
  [place] [facility] [place] Provider Note   CSN: [id number] Arrival date & time: 08/25/24  2149     History Chief Complaint  Patient presents with  . Penile Discharge    HPI Mario Luna is a 33 y.o. male presenting for ***.   Patient's recorded medical, surgical, social, medication list and allergies were reviewed in the Snapshot window as part of the initial history.   Review of Systems   Review of Systems  Physical Exam Updated Vital Signs BP (!) 138/96 (BP Location: Right Arm)   Pulse 82   Temp 97.7 F (36.5 C)   Resp 16   Ht 5' 6 (1.676 m)   Wt 83.9 kg   SpO2 100%   BMI 29.86 kg/m  Physical Exam   ED Course/ Medical Decision Making/ A&P    Procedures Procedures   Medications Ordered in ED Medications - No data to display  Medical Decision Making:   Mario Luna is a 33 y.o. male who presented to the ED today with *** detailed above.    {crccomplexity:27900} Complete initial physical exam performed, notably the patient  was ***.    Reviewed and confirmed nursing documentation for past medical history, family history, social history.    Initial Assessment:   With the patient's presentation of ***, most likely diagnosis is ***. Other diagnoses were considered including (but not limited to) ***. These are considered less likely due to history of present illness and physical exam findings.   {crccopa:27899}  Initial Plan:  ***  ***Screening labs including CBC and Metabolic panel to evaluate for infectious or metabolic etiology of disease.  ***Urinalysis with reflex culture ordered to evaluate for UTI or relevant urologic/nephrologic pathology.  ***CXR to evaluate for structural/infectious intrathoracic pathology.  {crccardiactesting:32591::EKG to evaluate for cardiac pathology} Objective evaluation as below reviewed   Initial Study Results:   Laboratory  All laboratory results reviewed without evidence of  clinically relevant pathology.   ***Exceptions include: ***   ***EKG EKG was reviewed independently. Rate, rhythm, axis, intervals all examined and without medically relevant abnormality. ST segments without concerns for elevations.    Radiology:  All images reviewed independently. ***Agree with radiology report at this time.   No results found.    Consults: Case discussed with ***.   Reassessment and Plan:   ***    ***  Clinical Impression: No diagnosis found.   Data Unavailable   Final Clinical Impression(s) / ED Diagnoses Final diagnoses:  None    Rx / DC Orders ED Discharge Orders     None

## 2024-08-26 LAB — RPR: RPR Ser Ql: NONREACTIVE

## 2024-08-26 LAB — HIV ANTIBODY (ROUTINE TESTING W REFLEX): HIV Screen 4th Generation wRfx: NONREACTIVE

## 2024-08-27 LAB — GC/CHLAMYDIA PROBE AMP (~~LOC~~) NOT AT ARMC
Chlamydia: POSITIVE — AB
Comment: NEGATIVE
Comment: NORMAL
Neisseria Gonorrhea: NEGATIVE

## 2024-08-28 ENCOUNTER — Ambulatory Visit (HOSPITAL_COMMUNITY): Payer: Self-pay
